# Patient Record
Sex: Female | Born: 1960 | Race: White | Hispanic: No | Marital: Single | Smoking: Current every day smoker
Health system: Southern US, Community
[De-identification: ages and names within clinical notes are randomized; demographics above are authoritative.]

## PROBLEM LIST (undated history)

## (undated) ENCOUNTER — Emergency Department (HOSPITAL_COMMUNITY): Admission: EM | Payer: Medicare Other | Source: Home / Self Care

## (undated) DIAGNOSIS — C801 Malignant (primary) neoplasm, unspecified: Secondary | ICD-10-CM

## (undated) DIAGNOSIS — K219 Gastro-esophageal reflux disease without esophagitis: Secondary | ICD-10-CM

## (undated) DIAGNOSIS — F419 Anxiety disorder, unspecified: Secondary | ICD-10-CM

## (undated) DIAGNOSIS — A159 Respiratory tuberculosis unspecified: Secondary | ICD-10-CM

## (undated) DIAGNOSIS — M1990 Unspecified osteoarthritis, unspecified site: Secondary | ICD-10-CM

## (undated) DIAGNOSIS — G4730 Sleep apnea, unspecified: Secondary | ICD-10-CM

## (undated) DIAGNOSIS — F32A Depression, unspecified: Secondary | ICD-10-CM

## (undated) DIAGNOSIS — M869 Osteomyelitis, unspecified: Secondary | ICD-10-CM

## (undated) DIAGNOSIS — T8859XA Other complications of anesthesia, initial encounter: Secondary | ICD-10-CM

## (undated) DIAGNOSIS — T7840XA Allergy, unspecified, initial encounter: Secondary | ICD-10-CM

## (undated) DIAGNOSIS — Z5189 Encounter for other specified aftercare: Secondary | ICD-10-CM

## (undated) DIAGNOSIS — J449 Chronic obstructive pulmonary disease, unspecified: Secondary | ICD-10-CM

## (undated) HISTORY — DX: Unspecified osteoarthritis, unspecified site: M19.90

## (undated) HISTORY — PX: APPENDECTOMY: SHX54

## (undated) HISTORY — PX: JOINT REPLACEMENT: SHX530

## (undated) HISTORY — DX: Allergy, unspecified, initial encounter: T78.40XA

## (undated) HISTORY — DX: Respiratory tuberculosis unspecified: A15.9

## (undated) HISTORY — DX: Malignant (primary) neoplasm, unspecified: C80.1

## (undated) HISTORY — PX: COLONOSCOPY: SHX174

## (undated) HISTORY — PX: BREAST SURGERY: SHX581

## (undated) HISTORY — DX: Anxiety disorder, unspecified: F41.9

## (undated) HISTORY — PX: COLON SURGERY: SHX602

## (undated) HISTORY — PX: LEG AMPUTATION THROUGH FEMUR: SHX695

## (undated) HISTORY — DX: Chronic obstructive pulmonary disease, unspecified: J44.9

## (undated) HISTORY — DX: Gastro-esophageal reflux disease without esophagitis: K21.9

## (undated) HISTORY — PX: CHOLECYSTECTOMY: SHX55

## (undated) HISTORY — PX: ABDOMINAL HYSTERECTOMY: SHX81

## (undated) HISTORY — DX: Sleep apnea, unspecified: G47.30

## (undated) HISTORY — PX: FRACTURE SURGERY: SHX138

## (undated) HISTORY — DX: Depression, unspecified: F32.A

---

## 2019-01-06 DIAGNOSIS — I82401 Acute embolism and thrombosis of unspecified deep veins of right lower extremity: Secondary | ICD-10-CM

## 2019-01-06 HISTORY — DX: Acute embolism and thrombosis of unspecified deep veins of right lower extremity: I82.401

## 2021-07-27 ENCOUNTER — Emergency Department (HOSPITAL_BASED_OUTPATIENT_CLINIC_OR_DEPARTMENT_OTHER)
Admission: EM | Admit: 2021-07-27 | Discharge: 2021-07-27 | Disposition: A | Discharge status: Home / Self Care | Payer: Medicare Other | Attending: Emergency Medicine | Admitting: Emergency Medicine

## 2021-07-27 ENCOUNTER — Encounter (HOSPITAL_BASED_OUTPATIENT_CLINIC_OR_DEPARTMENT_OTHER): Payer: Self-pay | Admitting: Emergency Medicine

## 2021-07-27 ENCOUNTER — Emergency Department (HOSPITAL_BASED_OUTPATIENT_CLINIC_OR_DEPARTMENT_OTHER): Payer: Medicare Other

## 2021-07-27 ENCOUNTER — Other Ambulatory Visit: Payer: Self-pay

## 2021-07-27 DIAGNOSIS — R6883 Chills (without fever): Secondary | ICD-10-CM | POA: Diagnosis present

## 2021-07-27 DIAGNOSIS — Z5329 Procedure and treatment not carried out because of patient's decision for other reasons: Secondary | ICD-10-CM | POA: Diagnosis not present

## 2021-07-27 DIAGNOSIS — J449 Chronic obstructive pulmonary disease, unspecified: Secondary | ICD-10-CM | POA: Insufficient documentation

## 2021-07-27 DIAGNOSIS — R600 Localized edema: Secondary | ICD-10-CM | POA: Diagnosis not present

## 2021-07-27 HISTORY — DX: Encounter for other specified aftercare: Z51.89

## 2021-07-27 LAB — COMPREHENSIVE METABOLIC PANEL
ALT: 11 U/L (ref 0–44)
AST: 16 U/L (ref 15–41)
Albumin: 3.8 g/dL (ref 3.5–5.0)
Alkaline Phosphatase: 116 U/L (ref 38–126)
Anion gap: 8 (ref 5–15)
BUN: 6 mg/dL (ref 6–20)
CO2: 33 mmol/L — ABNORMAL HIGH (ref 22–32)
Calcium: 8.6 mg/dL — ABNORMAL LOW (ref 8.9–10.3)
Chloride: 96 mmol/L — ABNORMAL LOW (ref 98–111)
Creatinine, Ser: 0.77 mg/dL (ref 0.44–1.00)
GFR, Estimated: >60 mL/min (ref >60)
Glucose, Bld: 96 mg/dL (ref 70–99)
Potassium: 3.7 mmol/L (ref 3.5–5.1)
Sodium: 137 mmol/L (ref 135–145)
Total Bilirubin: 0.4 mg/dL (ref 0.3–1.2)
Total Protein: 7.7 g/dL (ref 6.5–8.1)

## 2021-07-27 LAB — CBC WITH DIFFERENTIAL/PLATELET
Abs Immature Granulocytes: 0.03 10*3/uL (ref 0.00–0.07)
Basophils Absolute: 0.1 10*3/uL (ref 0.0–0.1)
Basophils Relative: 1 %
Eosinophils Absolute: 0.1 10*3/uL (ref 0.0–0.5)
Eosinophils Relative: 1 %
HCT: 45.5 % (ref 36.0–46.0)
Hemoglobin: 14.9 g/dL (ref 12.0–15.0)
Immature Granulocytes: 0 %
Lymphocytes Relative: 27 %
Lymphs Abs: 2.8 10*3/uL (ref 0.7–4.0)
MCH: 31.6 pg (ref 26.0–34.0)
MCHC: 32.7 g/dL (ref 30.0–36.0)
MCV: 96.6 fL (ref 80.0–100.0)
Monocytes Absolute: 0.4 10*3/uL (ref 0.1–1.0)
Monocytes Relative: 4 %
Neutro Abs: 7 10*3/uL (ref 1.7–7.7)
Neutrophils Relative %: 67 %
Platelets: 231 10*3/uL (ref 150–400)
RBC: 4.71 MIL/uL (ref 3.87–5.11)
RDW: 13.2 % (ref 11.5–15.5)
WBC: 10.5 10*3/uL (ref 4.0–10.5)
nRBC: 0 % (ref 0.0–0.2)

## 2021-07-27 LAB — SEDIMENTATION RATE: Sed Rate: 15 mm/hr (ref 0–22)

## 2021-07-27 LAB — LACTIC ACID, PLASMA: Lactic Acid, Venous: 0.8 mmol/L (ref 0.5–1.9)

## 2021-07-27 IMAGING — DX DG FEMUR 1V*R*
1 series · 1 of 1 positions shown · non-contrast
Comparison: None.

CLINICAL DATA: Prior above the knee amputation

EXAM:
RIGHT FEMUR 1 VIEW

[femur lat]
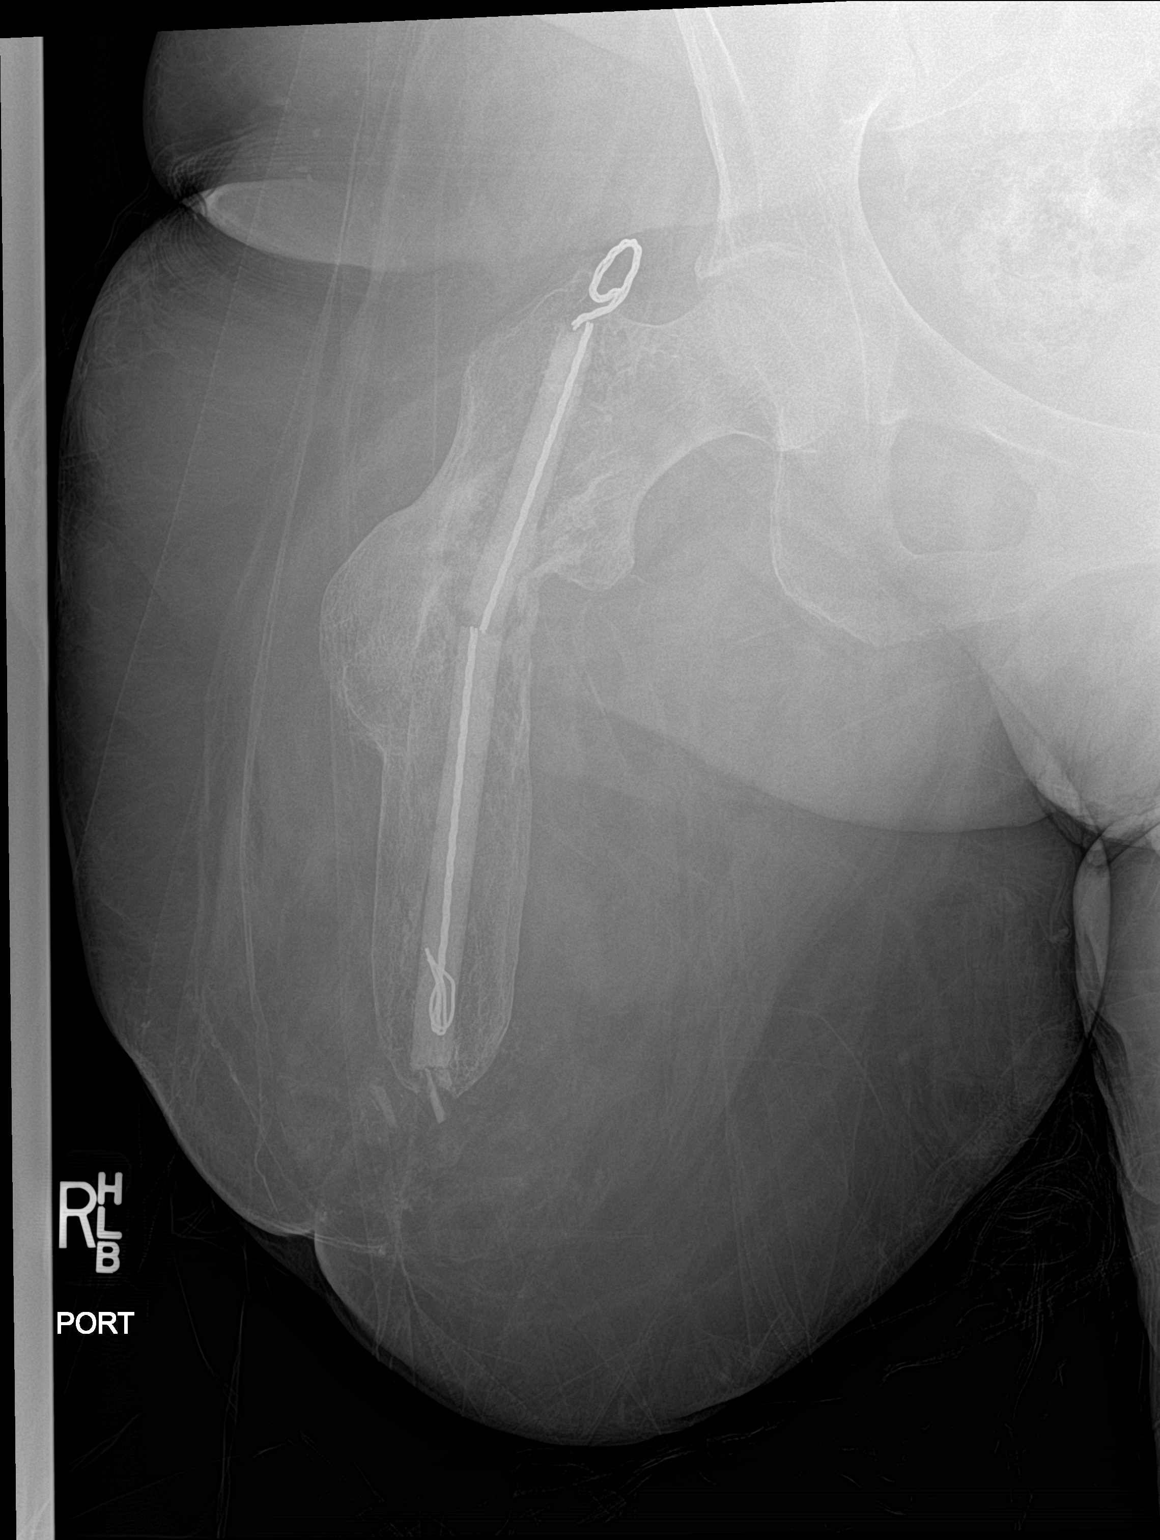

[1 of 1 positions shown; findings below may reference images not displayed]

FINDINGS: Prior above the knee amputation with fractured intramedullary rod.
Bony irregularity and mild fragmentation is seen at the osteotomy
site.
IMPRESSION: Bony irregularity and mild fragmentation seen at the right femur
osteotomy site, potentially postsurgical, although osteomyelitis
could also have this appearance. Recommend comparison with outside
priors if available.

## 2021-07-27 MED ORDER — LORAZEPAM 1 MG PO TABS
0.5000 mg | ORAL_TABLET | Freq: Once | ORAL | Status: AC
  Administered 2021-07-27: 0.5 mg via ORAL
  Filled 2021-07-27: qty 1

## 2021-07-27 MED ORDER — PRAMIPEXOLE DIHYDROCHLORIDE 1 MG PO TABS: 1.0000 mg | ORAL_TABLET | Freq: Once | ORAL | Status: DC

## 2021-07-27 NOTE — ED Triage Notes (Signed)
Chills x 1 month , Hx anemia. Hx osteomyelitis.  ?

## 2021-07-27 NOTE — ED Provider Notes (Incomplete)
°  [facility]Y [facility]PART[facility] Provider Note   CSN: [id number] Arrival date & time: 07/27/21  1306     History {Add pertinent medical, surgical, social history, OB history to HPI:1} Chief Complaint  Patient presents with   Chills    Renee Pitts is a 61 y.o. female.  Patient with history of osteomyelitis  The history is provided by the patient. No language interpreter was used.      Home Medications Prior to Admission medications   Not on File      Allergies    Vancomycin    Review of Systems   Review of Systems  Physical Exam Updated Vital Signs BP 139/88 (BP Location: Right Arm)    Pulse 88    Temp 98 F (36.7 C) (Oral)    Resp 18    Wt 86.2 kg    SpO2 95%  Physical Exam  ED Results / Procedures / Treatments   Labs (all labs ordered are listed, but only abnormal results are displayed) Labs Reviewed  CBC WITH DIFFERENTIAL/PLATELET  COMPREHENSIVE METABOLIC PANEL    EKG None  Radiology No results found.  Procedures Procedures  {Document cardiac monitor, telemetry assessment procedure when appropriate:1}  Medications Ordered in ED Medications - No data to display  ED Course/ Medical Decision Making/ A&P                           Medical Decision Making Amount and/or Complexity of Data Reviewed Labs: ordered. Radiology: ordered.   ***  {Document critical care time when appropriate:1} {Document review of labs and clinical decision tools ie heart score, Chads2Vasc2 etc:1}  {Document your independent review of radiology images, and any outside records:1} {Document your discussion with family members, caretakers, and with consultants:1} {Document social determinants of health affecting pt's care:1} {Document your decision making why or why not admission, treatments were needed:1} Final Clinical Impression(s) / ED Diagnoses Final diagnoses:  None    Rx / DC Orders ED Discharge Orders     None

## 2021-07-27 NOTE — ED Notes (Signed)
PA Sarah into [facility] to pt , pt sitting in her w/c stating she didn't realize it was going to take so long and she was given meds for her  restless leg , pt states she is living in hotel and had not atken any of her meds today ?

## 2021-07-28 LAB — C-REACTIVE PROTEIN: CRP: 0.8 mg/dL (ref <1.0)

## 2021-08-13 ENCOUNTER — Encounter (HOSPITAL_COMMUNITY): Payer: Self-pay | Admitting: Emergency Medicine

## 2021-08-13 ENCOUNTER — Emergency Department (HOSPITAL_COMMUNITY): Payer: Medicare Other

## 2021-08-13 ENCOUNTER — Emergency Department (HOSPITAL_COMMUNITY)
Admission: EM | Admit: 2021-08-13 | Discharge: 2021-08-14 | Disposition: A | Discharge status: Home / Self Care | Payer: Medicare Other | Attending: Emergency Medicine | Admitting: Emergency Medicine

## 2021-08-13 ENCOUNTER — Other Ambulatory Visit: Payer: Self-pay

## 2021-08-13 DIAGNOSIS — M79651 Pain in right thigh: Secondary | ICD-10-CM | POA: Diagnosis not present

## 2021-08-13 DIAGNOSIS — R200 Anesthesia of skin: Secondary | ICD-10-CM | POA: Diagnosis not present

## 2021-08-13 DIAGNOSIS — J449 Chronic obstructive pulmonary disease, unspecified: Secondary | ICD-10-CM | POA: Diagnosis not present

## 2021-08-13 DIAGNOSIS — R5383 Other fatigue: Secondary | ICD-10-CM | POA: Insufficient documentation

## 2021-08-13 DIAGNOSIS — R4789 Other speech disturbances: Secondary | ICD-10-CM | POA: Insufficient documentation

## 2021-08-13 DIAGNOSIS — R1031 Right lower quadrant pain: Secondary | ICD-10-CM | POA: Diagnosis not present

## 2021-08-13 DIAGNOSIS — R531 Weakness: Secondary | ICD-10-CM | POA: Diagnosis not present

## 2021-08-13 DIAGNOSIS — R519 Headache, unspecified: Secondary | ICD-10-CM | POA: Diagnosis present

## 2021-08-13 DIAGNOSIS — R479 Unspecified speech disturbances: Secondary | ICD-10-CM

## 2021-08-13 LAB — COMPREHENSIVE METABOLIC PANEL
ALT: 12 U/L (ref 0–44)
AST: 19 U/L (ref 15–41)
Albumin: 3.4 g/dL — ABNORMAL LOW (ref 3.5–5.0)
Alkaline Phosphatase: 113 U/L (ref 38–126)
Anion gap: 5 (ref 5–15)
BUN: 8 mg/dL (ref 6–20)
CO2: 32 mmol/L (ref 22–32)
Calcium: 8.7 mg/dL — ABNORMAL LOW (ref 8.9–10.3)
Chloride: 103 mmol/L (ref 98–111)
Creatinine, Ser: 1.08 mg/dL — ABNORMAL HIGH (ref 0.44–1.00)
GFR, Estimated: 59 mL/min — ABNORMAL LOW (ref >60)
Glucose, Bld: 101 mg/dL — ABNORMAL HIGH (ref 70–99)
Potassium: 3.5 mmol/L (ref 3.5–5.1)
Sodium: 140 mmol/L (ref 135–145)
Total Bilirubin: 0.3 mg/dL (ref 0.3–1.2)
Total Protein: 6.9 g/dL (ref 6.5–8.1)

## 2021-08-13 LAB — CBC WITH DIFFERENTIAL/PLATELET
Abs Immature Granulocytes: 0.05 10*3/uL (ref 0.00–0.07)
Basophils Absolute: 0.1 10*3/uL (ref 0.0–0.1)
Basophils Relative: 1 %
Eosinophils Absolute: 0.1 10*3/uL (ref 0.0–0.5)
Eosinophils Relative: 1 %
HCT: 44 % (ref 36.0–46.0)
Hemoglobin: 14.5 g/dL (ref 12.0–15.0)
Immature Granulocytes: 1 %
Lymphocytes Relative: 27 %
Lymphs Abs: 2.7 10*3/uL (ref 0.7–4.0)
MCH: 31.7 pg (ref 26.0–34.0)
MCHC: 33 g/dL (ref 30.0–36.0)
MCV: 96.1 fL (ref 80.0–100.0)
Monocytes Absolute: 0.4 10*3/uL (ref 0.1–1.0)
Monocytes Relative: 4 %
Neutro Abs: 6.8 10*3/uL (ref 1.7–7.7)
Neutrophils Relative %: 66 %
Platelets: 198 10*3/uL (ref 150–400)
RBC: 4.58 MIL/uL (ref 3.87–5.11)
RDW: 12.5 % (ref 11.5–15.5)
WBC: 10.1 10*3/uL (ref 4.0–10.5)
nRBC: 0 % (ref 0.0–0.2)

## 2021-08-13 LAB — TSH: TSH: 0.897 u[IU]/mL (ref 0.350–4.500)

## 2021-08-13 LAB — LIPASE, BLOOD: Lipase: 26 U/L (ref 11–51)

## 2021-08-13 LAB — LACTIC ACID, PLASMA: Lactic Acid, Venous: 1 mmol/L (ref 0.5–1.9)

## 2021-08-13 IMAGING — DX DG CHEST 1V PORT
1 series · 1 of 1 positions shown · non-contrast
Comparison: None.

CLINICAL DATA: Headache and difficulty finding words x3 days.

EXAM:
PORTABLE CHEST 1 VIEW

[chest ap]
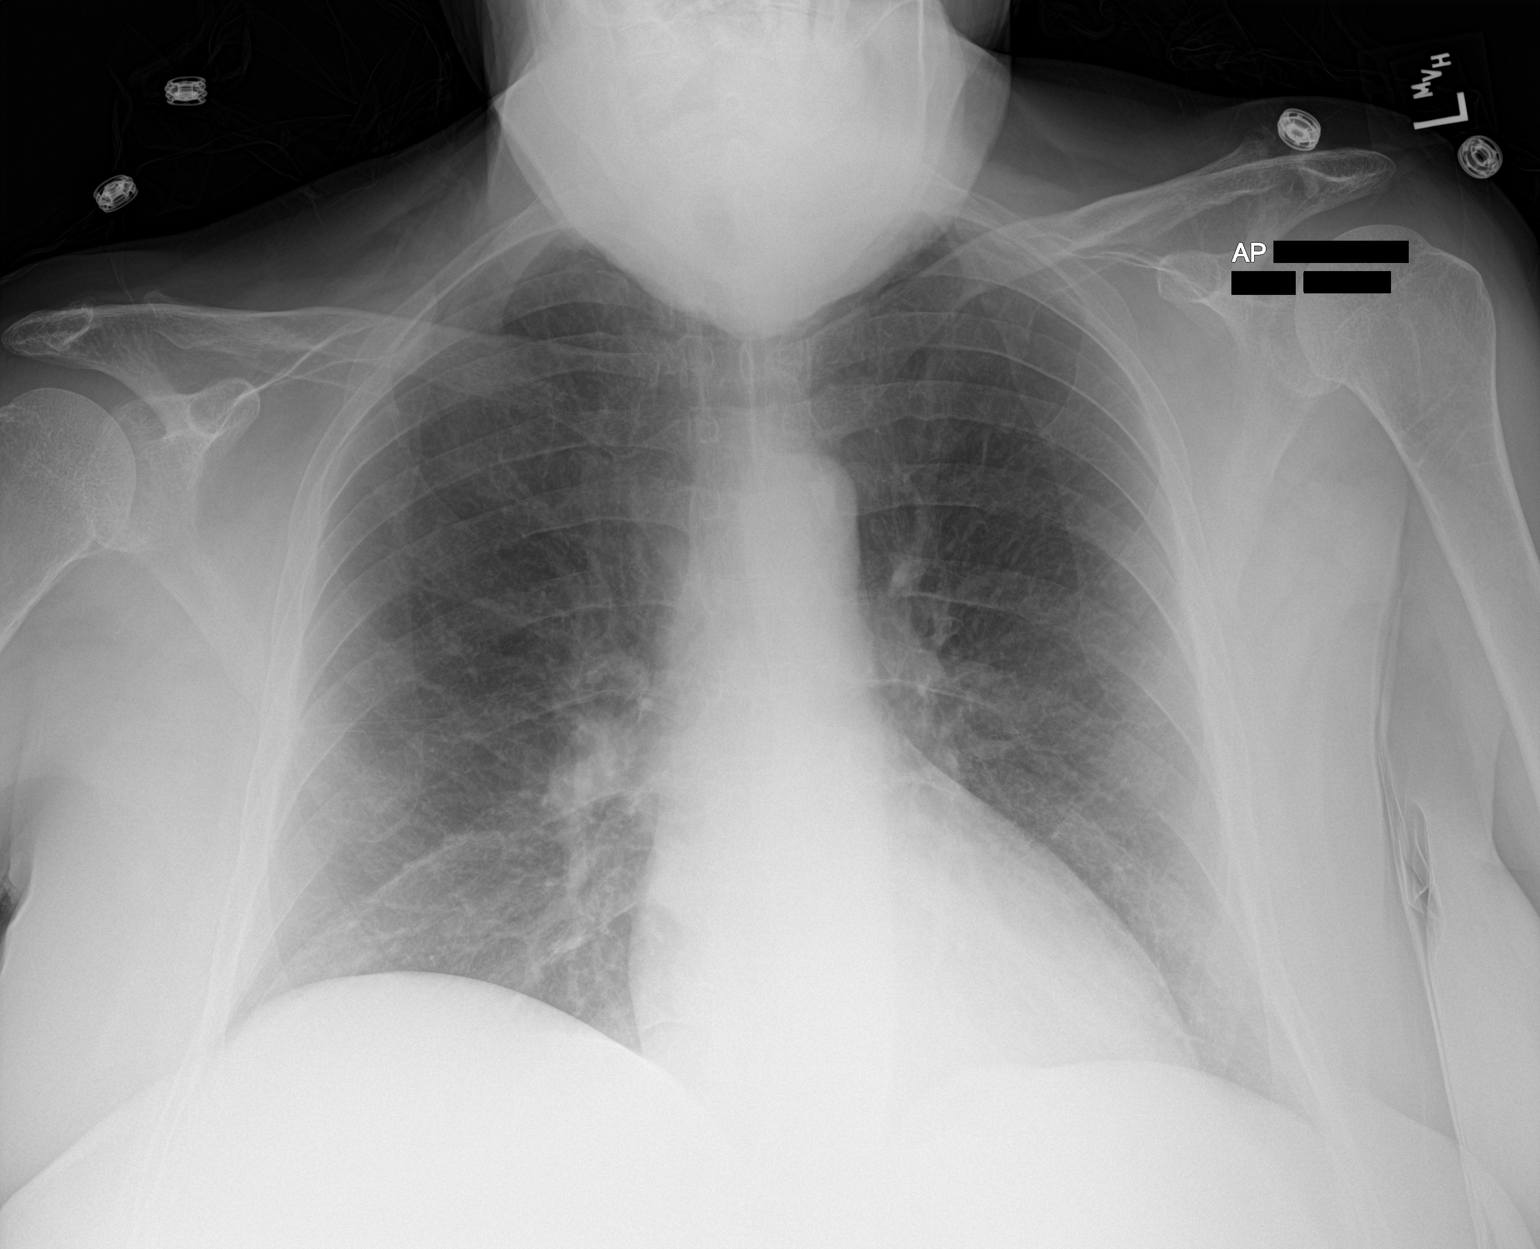

[1 of 1 positions shown; findings below may reference images not displayed]

FINDINGS: The heart size and mediastinal contours are within normal limits.
Both lungs are clear. The visualized skeletal structures are
unremarkable.
IMPRESSION: No active disease.

## 2021-08-13 IMAGING — CT CT ABD-PELV W/ CM
2 of 5 series · 16 of 46 positions shown, 18 images · IV contrast (agent unspecified)
Comparison: None.

CLINICAL DATA: Right lower quadrant abdominal pain.

EXAM:
CT ABDOMEN AND PELVIS WITH CONTRAST
TECHNIQUE: Multidetector CT imaging of the abdomen and pelvis was performed
using the standard protocol following bolus administration of
intravenous contrast.

[Series 3: axial st · axial · 0.98mm/px · z∈[-922,-482]mm · 13 of 102 slices shown, 15 images]
[im 7/102  soft-tissue]
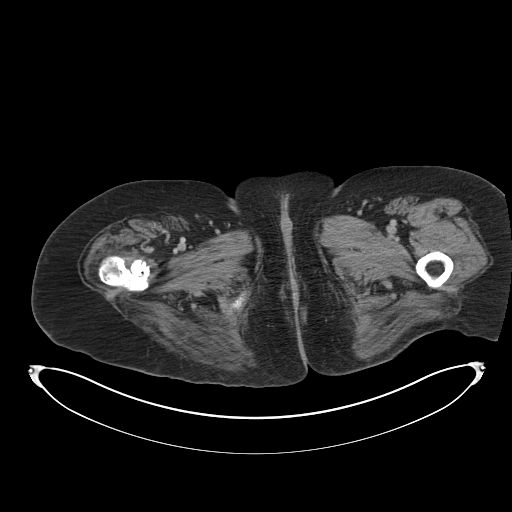
[im 7/102  bone]
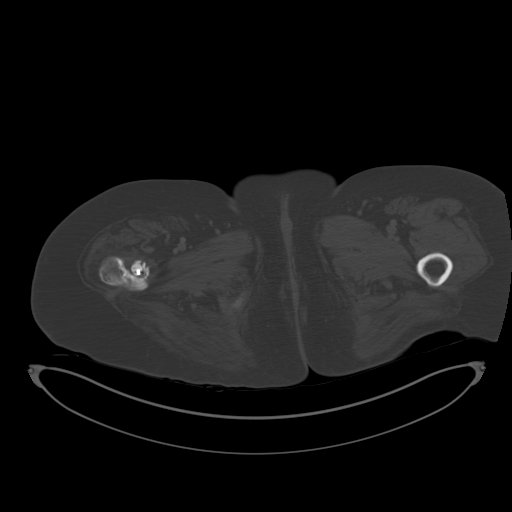
[im 14/102  soft-tissue]
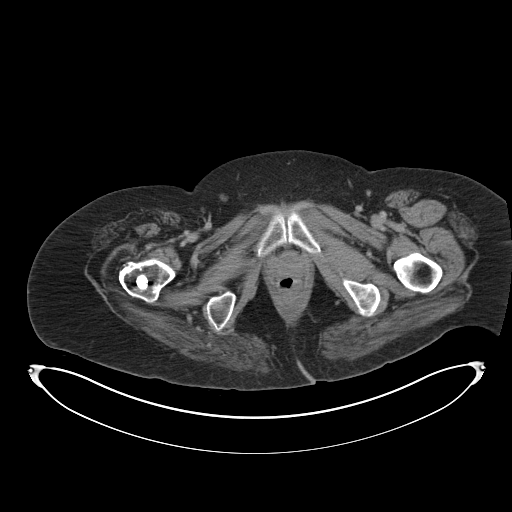
[im 21/102  soft-tissue]
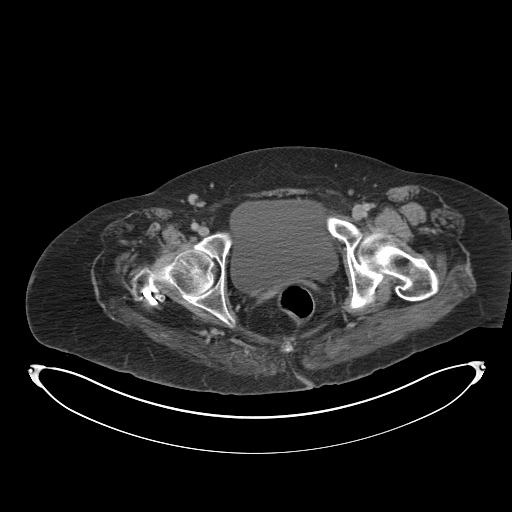
[im 27/102  soft-tissue]
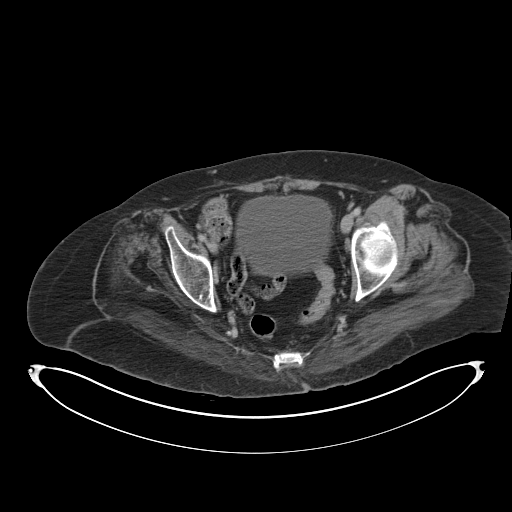
[im 34/102  soft-tissue]
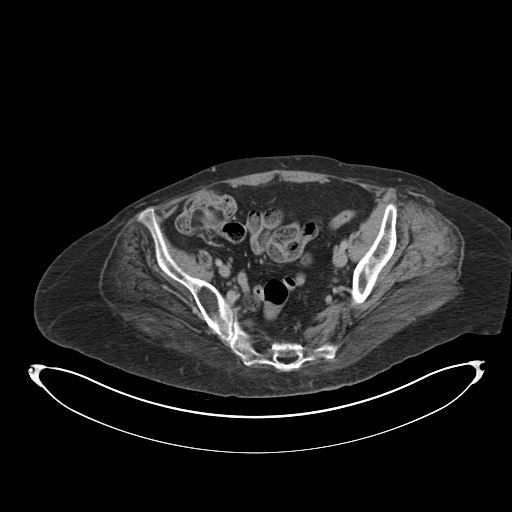
[im 41/102  soft-tissue]
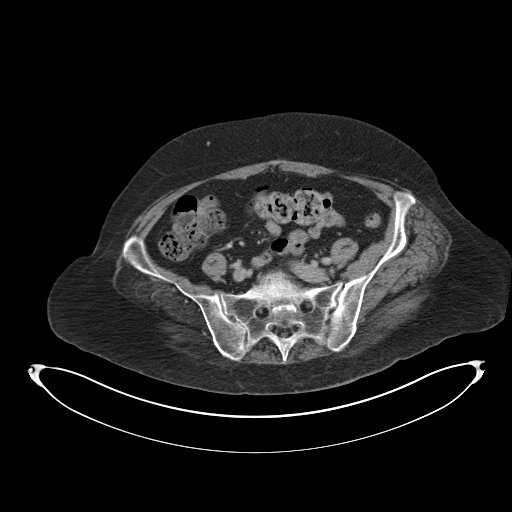
[im 54/102  soft-tissue]
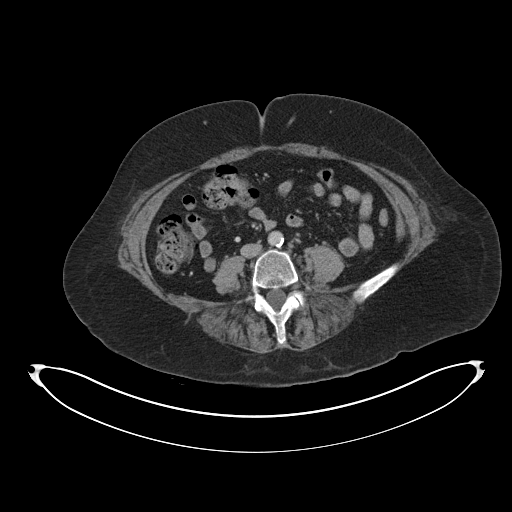
[im 61/102  soft-tissue]
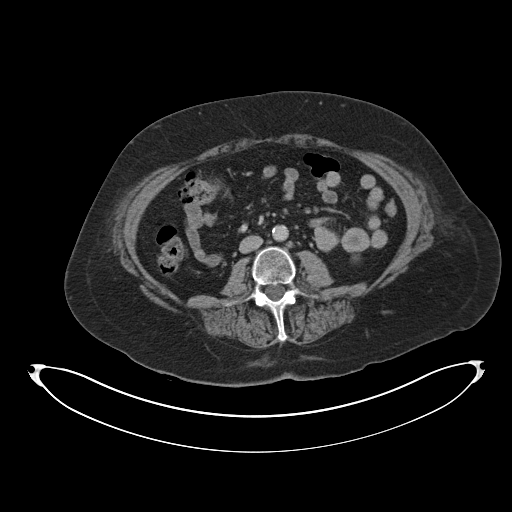
[im 68/102  soft-tissue]
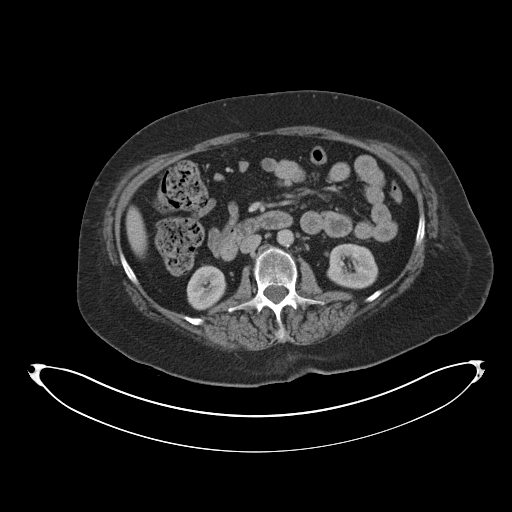
[im 68/102  bone]
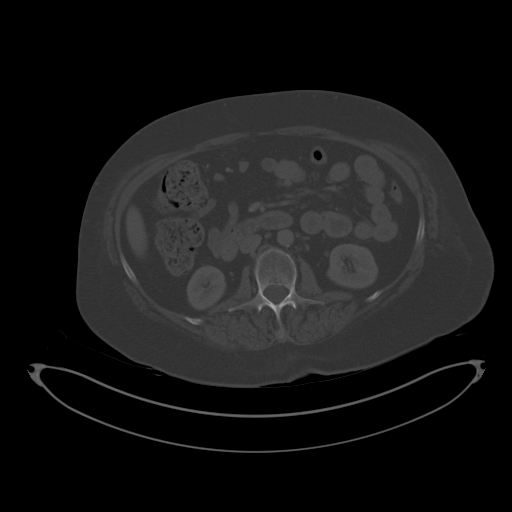
[im 75/102  soft-tissue]
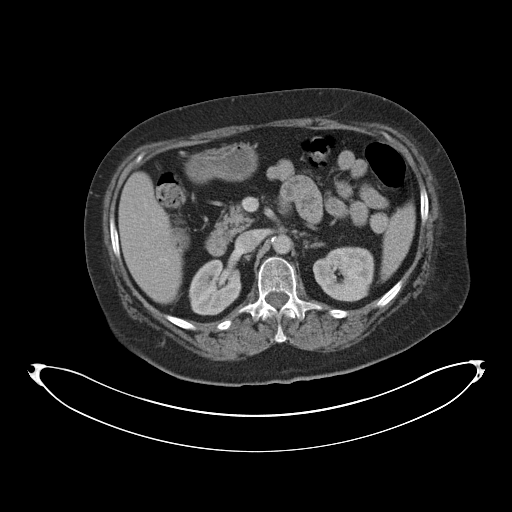
[im 81/102  soft-tissue]
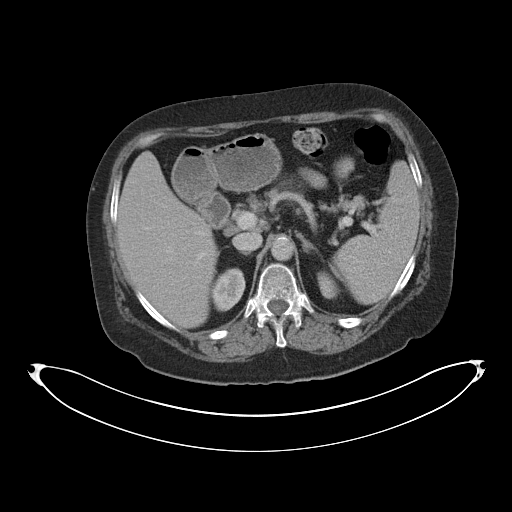
[im 88/102  soft-tissue]
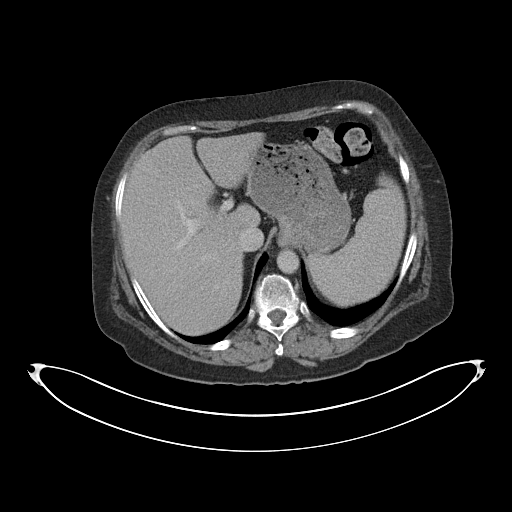
[im 95/102  soft-tissue]
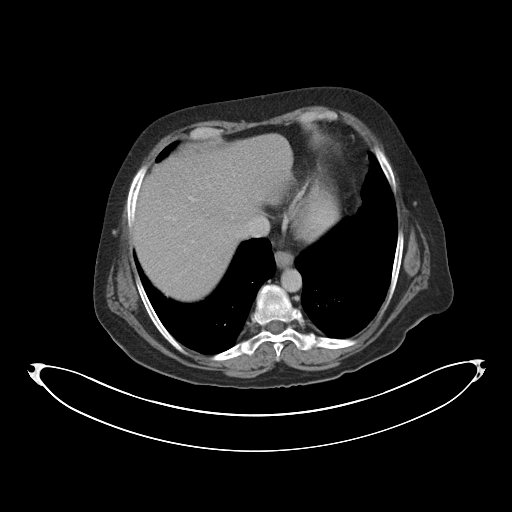

[Series 7: coronal st · coronal · 1.00mm/px · 3 of 169 slices shown]
[im 57/169  soft-tissue]
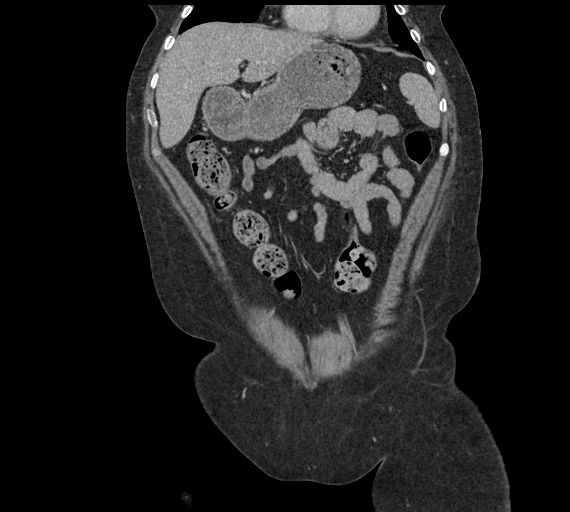
[im 75/169  soft-tissue]
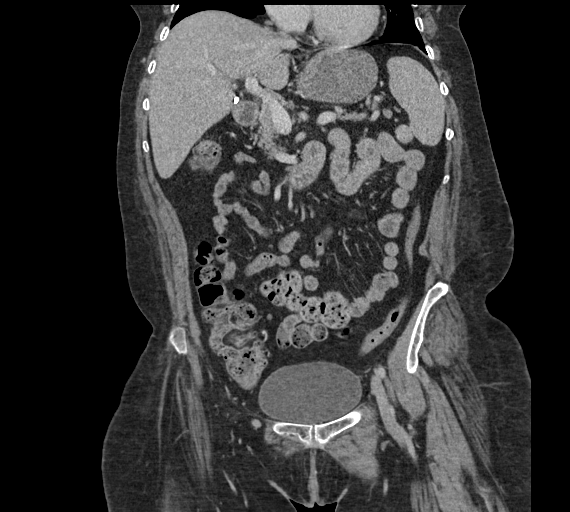
[im 94/169  soft-tissue]
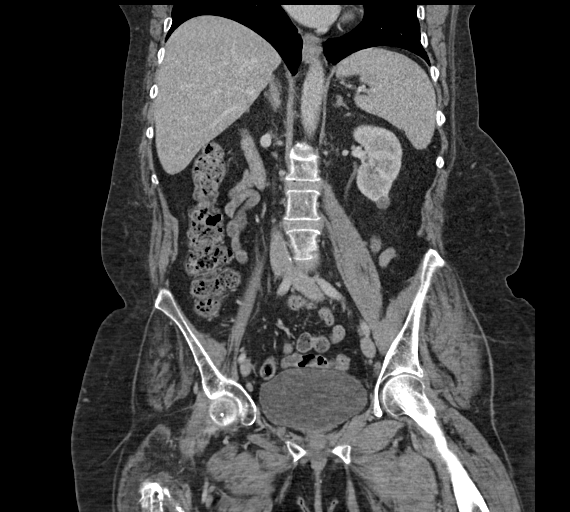

[16 of 46 positions shown; findings below may reference images not displayed]

RADIATION DOSE REDUCTION: This exam was performed according to the
departmental dose-optimization program which includes automated
exposure control, adjustment of the mA and/or kV according to
patient size and/or use of iterative reconstruction technique.

CONTRAST:  80mL OMNIPAQUE IOHEXOL 300 MG/ML  SOLN
FINDINGS: Lower chest: Minimal bibasilar dependent atelectasis. The visualized
lung bases are otherwise clear.

No intra-abdominal free air or free fluid.

Hepatobiliary: Probable mild fatty liver. No intrahepatic biliary
dilatation. Cholecystectomy.

Pancreas: Unremarkable. No pancreatic ductal dilatation or
surrounding inflammatory changes.

Spleen: Normal in size without focal abnormality.

Adrenals/Urinary Tract: The adrenal glands are unremarkable. There
is a 15 mm left renal inferior pole cyst. There is no hydronephrosis
on either side. There is symmetric enhancement and excretion of
contrast by both kidneys. The visualized ureters and urinary bladder
appear unremarkable.

Stomach/Bowel: There is no bowel obstruction or active inflammation.
The appendix is not visualized with certainty. No inflammatory
changes identified in the right lower quadrant.

Vascular/Lymphatic: Mild aortoiliac atherosclerotic disease. The IVC
is unremarkable. No portal venous gas. There is no adenopathy.

Reproductive: Hysterectomy.  No adnexal masses.

Other: None

Musculoskeletal: Prior ORIF of right femur. There is fracture of the
visualized proximal aspect of the intramedullary rod or segment.
There is a large lucency with an age indeterminate nondisplaced
fracture of the anterior cortex of the femur which may represent a
pathologic fracture. Similar findings were seen on the radiograph of
[DATE]. Direct comparison with prior images, if available,
recommended. No other acute fracture. Old L2 compression fracture.
IMPRESSION: 1. No acute intra-abdominal or pelvic pathology.
2. Prior ORIF of right femur with fracture of the visualized
proximal aspect of the intramedullary rod or segment. There is a
large lucency with an age indeterminate nondisplaced fracture of the
anterior cortex of the femur which may represent a pathologic
fracture. Similar findings were seen on the radiograph of
[DATE]. Direct comparison with prior images, if available,
recommended.
3. Aortic Atherosclerosis ([QC]-[QC]).

## 2021-08-13 IMAGING — CT CT FEMUR *R* W/ CM
3 of 4 series · 13 of 33 positions shown, 16 images · IV contrast (agent unspecified)
Comparison: [DATE].

CLINICAL DATA: Osteomyelitis suspected, femur.

EXAM:
CT OF THE LOWER RIGHT EXTREMITY WITH CONTRAST
TECHNIQUE: Multidetector CT imaging of the lower right extremity was performed
according to the standard protocol following intravenous contrast
administration.

[Series 4: axial bone · axial · 0.62mm/px · z∈[-1118,-852]mm · 5 of 173 slices shown, 7 images]
[im 20/173  soft-tissue]
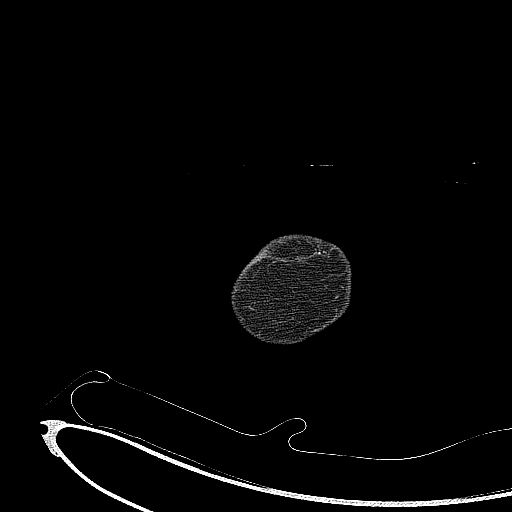
[im 20/173  bone]
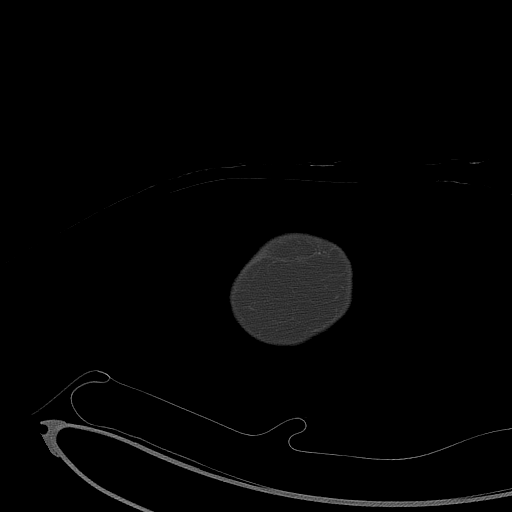
[im 58/173  bone]
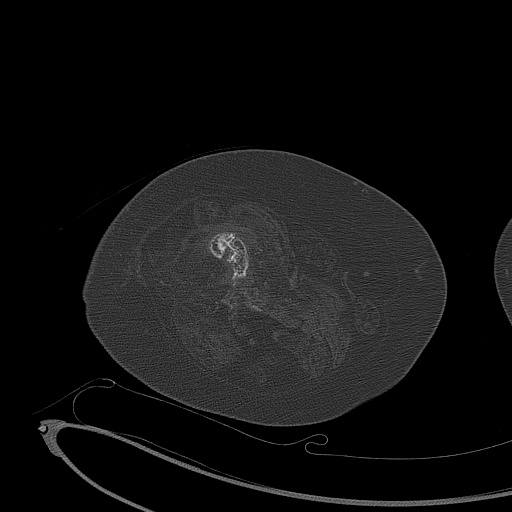
[im 96/173  bone]
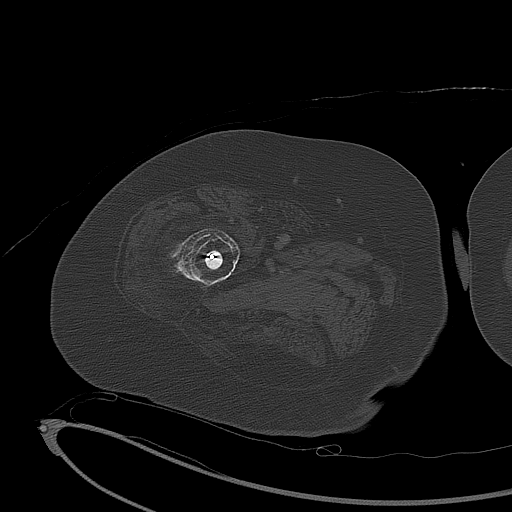
[im 115/173  bone]
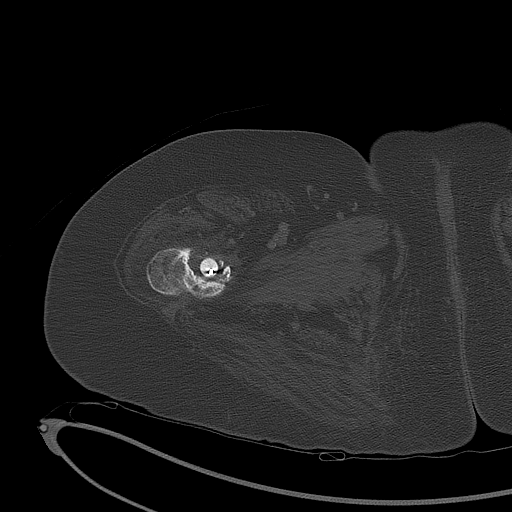
[im 153/173  soft-tissue]
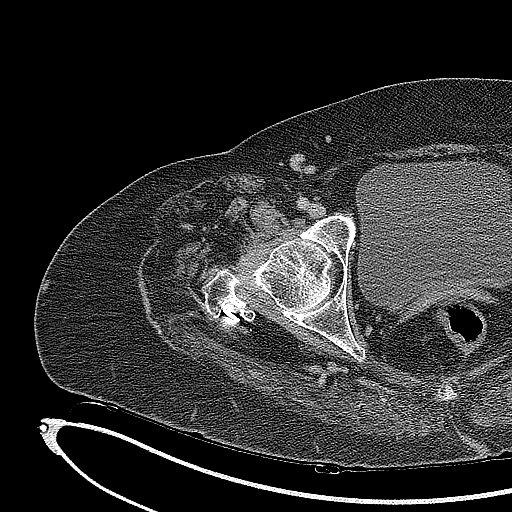
[im 153/173  bone]
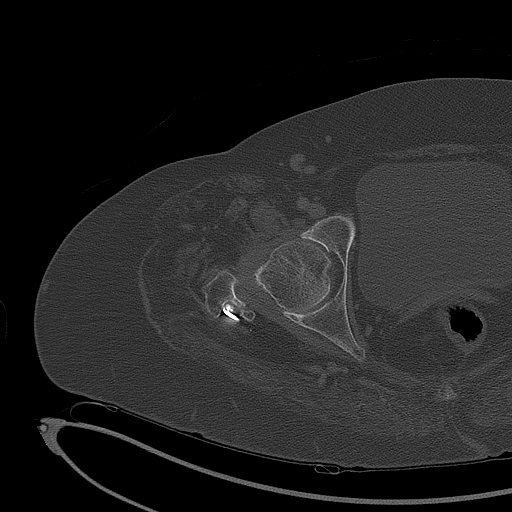

[Series 9: coronal st · coronal · 0.64mm/px · 3 of 135 slices shown]
[im 27/135  bone]
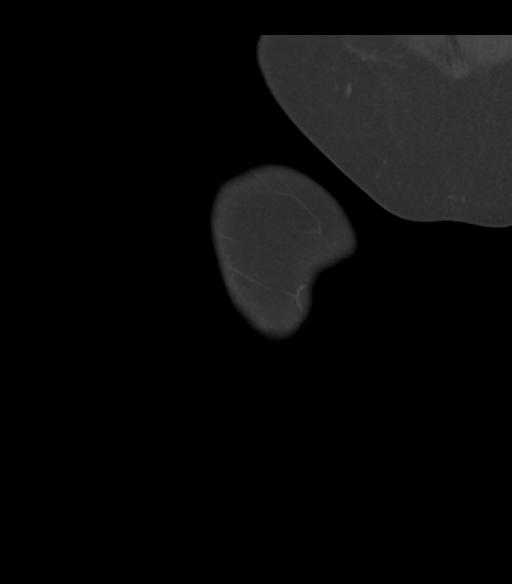
[im 54/135  bone]
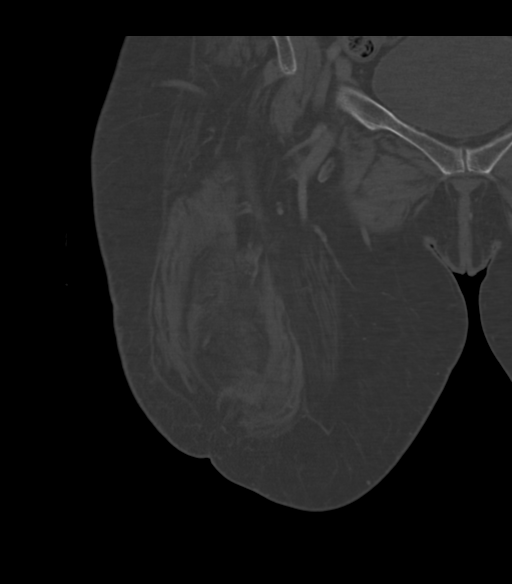
[im 81/135  bone]
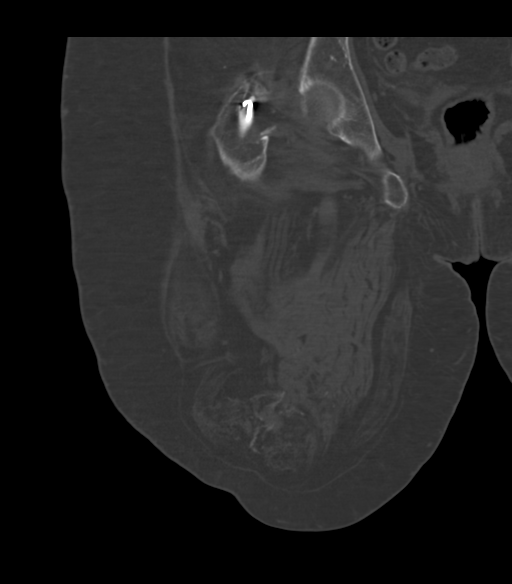

[Series 10: sagittal st · sagittal · 0.52mm/px · 5 of 149 slices shown, 6 images]
[im 50/149  bone]
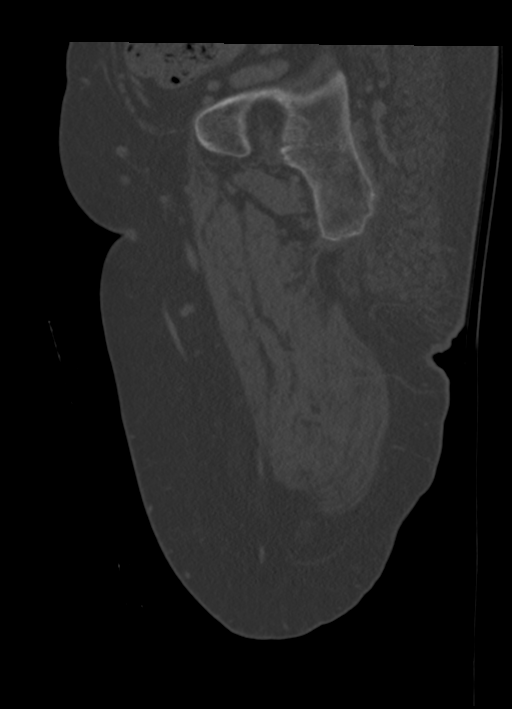
[im 62/149  bone]
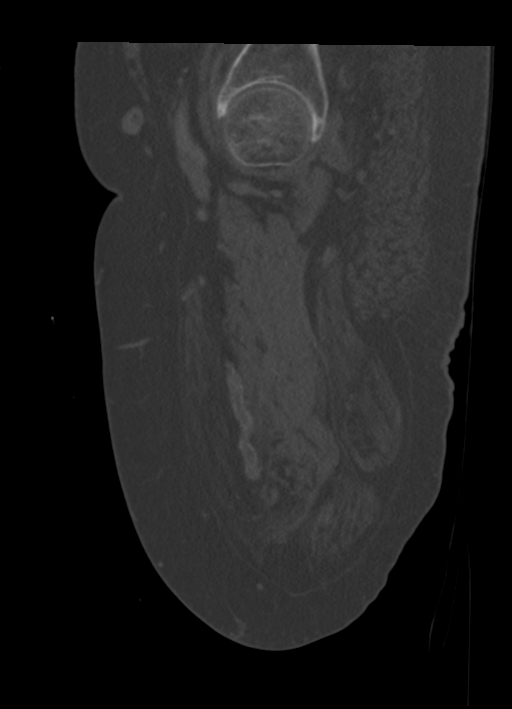
[im 75/149  soft-tissue]
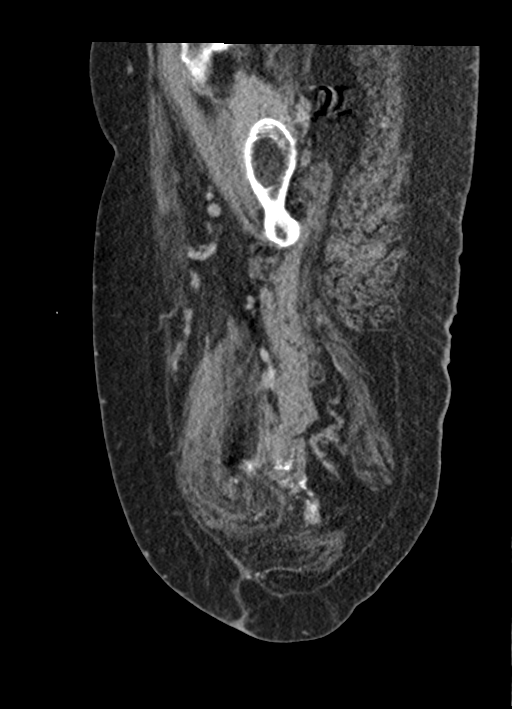
[im 75/149  bone]
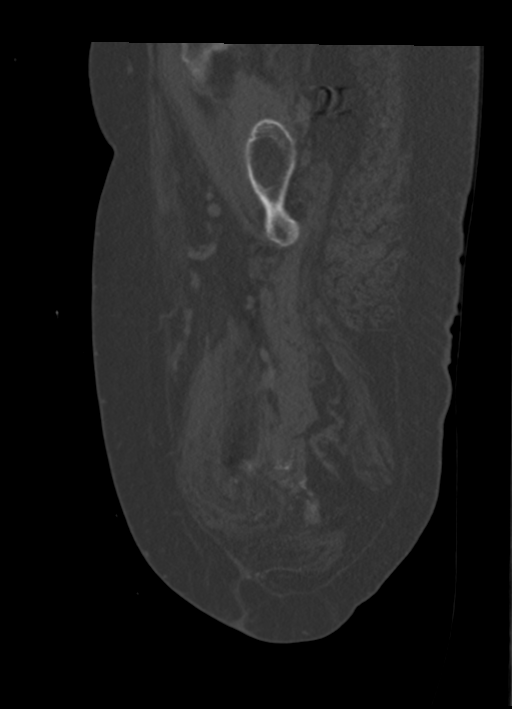
[im 87/149  bone]
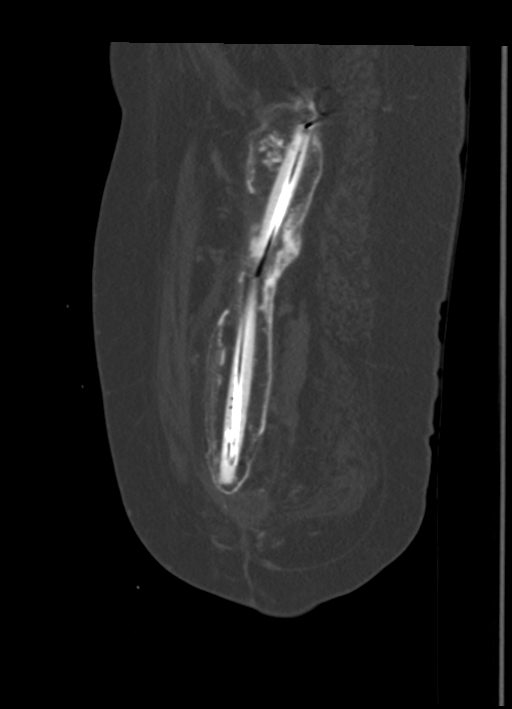
[im 99/149  bone]
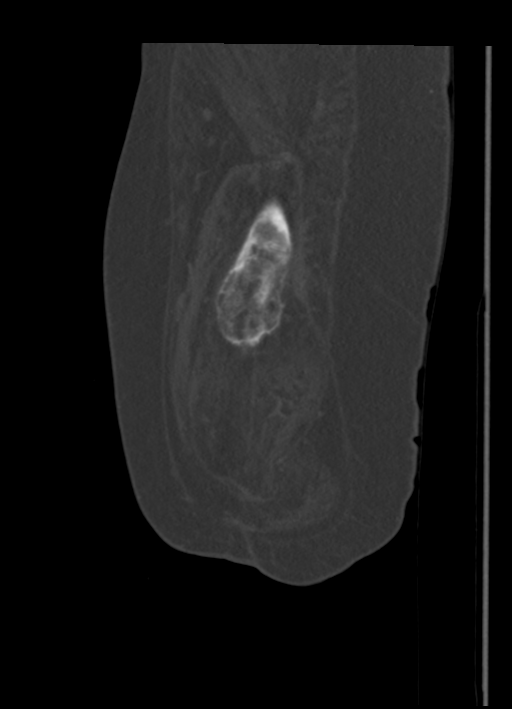

[13 of 33 positions shown; findings below may reference images not displayed]

RADIATION DOSE REDUCTION: This exam was performed according to the
departmental dose-optimization program which includes automated
exposure control, adjustment of the mA and/or kV according to
patient size and/or use of iterative reconstruction technique.

CONTRAST:  80mL OMNIPAQUE IOHEXOL 300 MG/ML  SOLN
FINDINGS: Bones/Joint/Cartilage

The patient is status post above the knee amputation at the level of
the proximal femoral shaft. There are linear bony fragments adjacent
to the amputation site with lucencies in this region. No periosteal
elevation is seen. There is an old fracture deformity with
intramedullary rod in place involving the proximal femur with
surrounding lucency and bony fragments.

Ligaments

Suboptimally assessed by CT.

Muscles and Tendons

Muscular atrophy with fatty infiltration is noted and most
pronounced in the quadriceps compartment.

Soft tissues

There is a circumscribed fluid collection with a thickened wall at
the base of the amputation measuring 2.7 x 2.1 cm. Bony fragments
are seen adjacent to this region and may be postsurgical. No
inguinal lymphadenopathy is seen.
IMPRESSION: 1. Status post above-the-knee amputation. There is a circumscribed
fluid collection at the amputation site measuring 2.7 x 2.1 cm with
a thickened wall. Findings may represent seroma, hematoma or
abscess.
2. Old fracture deformity in the proximal femur with intramedullary
rod in place. Lucencies and bony fragments surround the
intramedullary rod. There are also bony fragments and lucencies at
the amputation site. Comparison with older imaging studies would be
beneficial if available. The possibility of osteomyelitis can not be
completely excluded. MRI is suggested for further evaluation.

## 2021-08-13 IMAGING — CT CT HEAD W/O CM
3 series · 14 of 47 positions shown, 16 images · non-contrast
Comparison: Comparison made with brain MRI performed on the same
day.

CLINICAL DATA: Initial evaluation for acute headache.



[Series 2: head wo · axial · 0.47mm/px · z∈[-198,-68]mm · 8 of 32 slices shown, 10 images]
[im 3/32  brain]
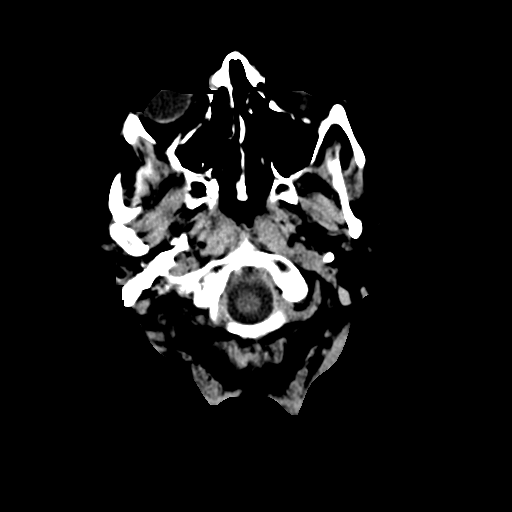
[im 3/32  bone]
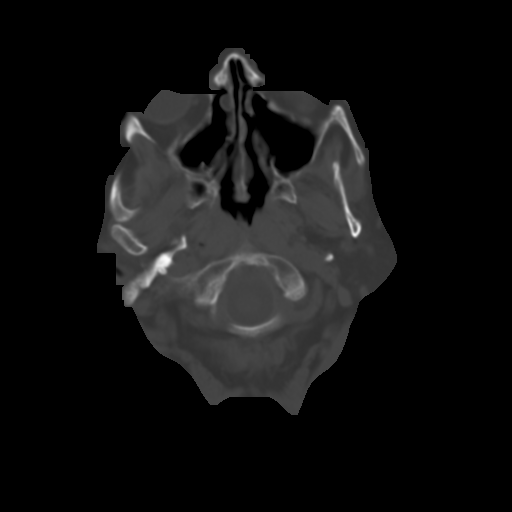
[im 7/32  brain]
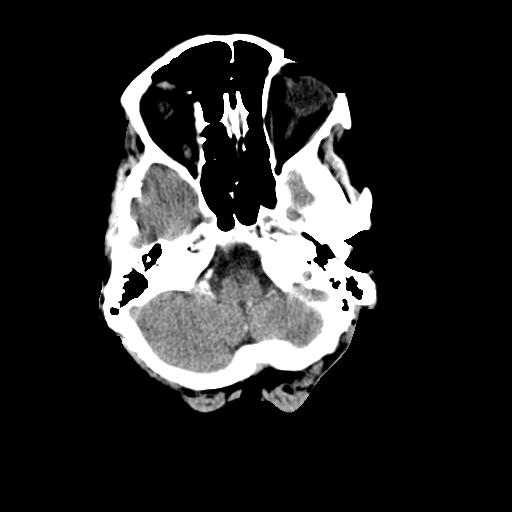
[im 10/32  brain]
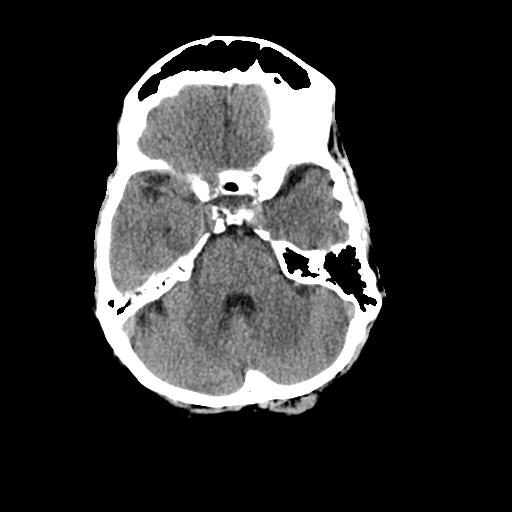
[im 14/32  brain]
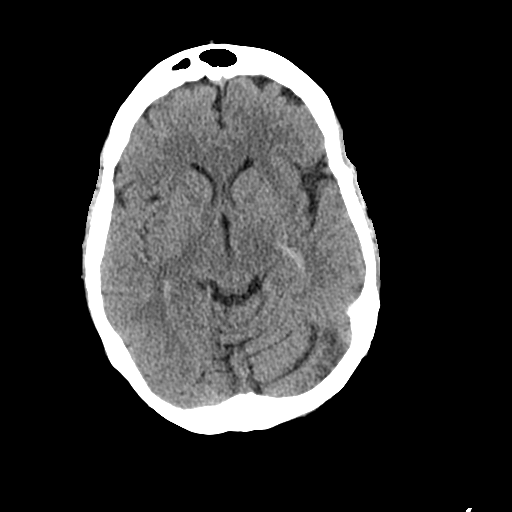
[im 18/32  brain]
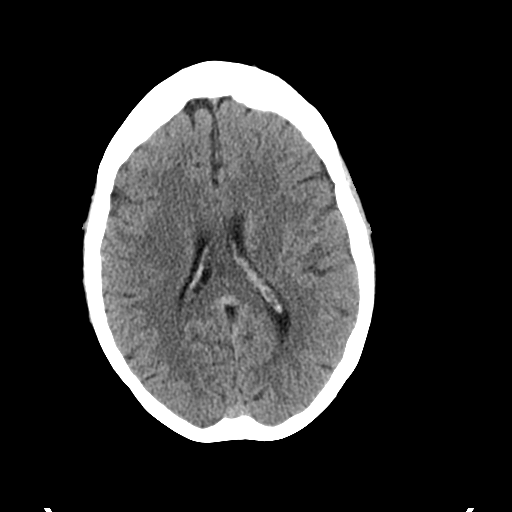
[im 18/32  bone]
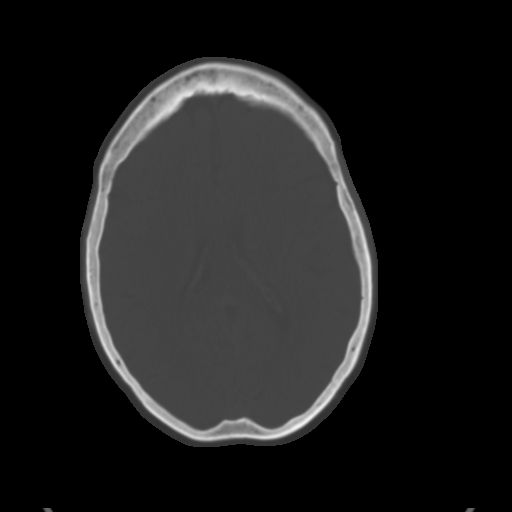
[im 22/32  brain]
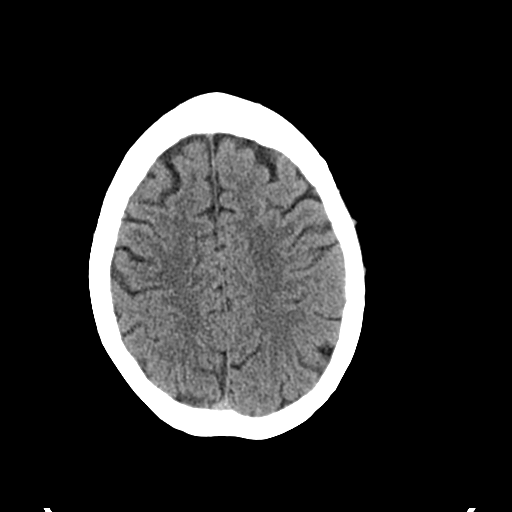
[im 25/32  brain]
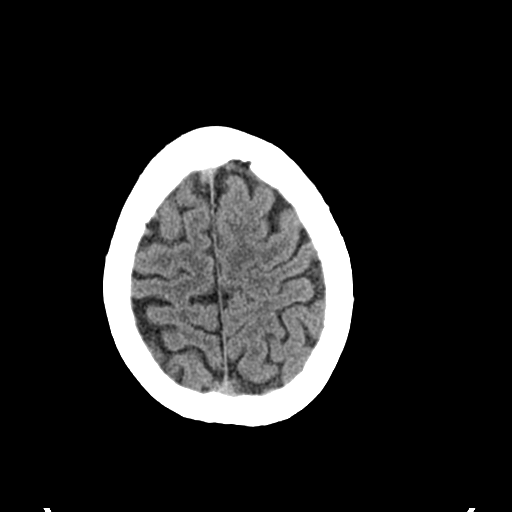
[im 29/32  brain]
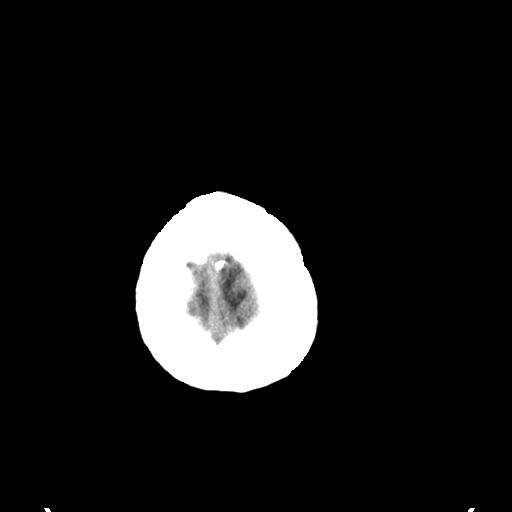

[Series 5: coronal soft tissue · coronal · 0.31mm/px · 3 of 81 slices shown]
[im 27/81  brain]
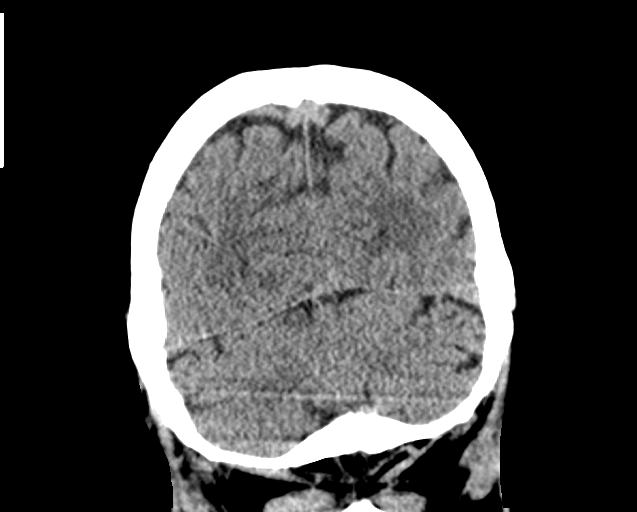
[im 36/81  brain]
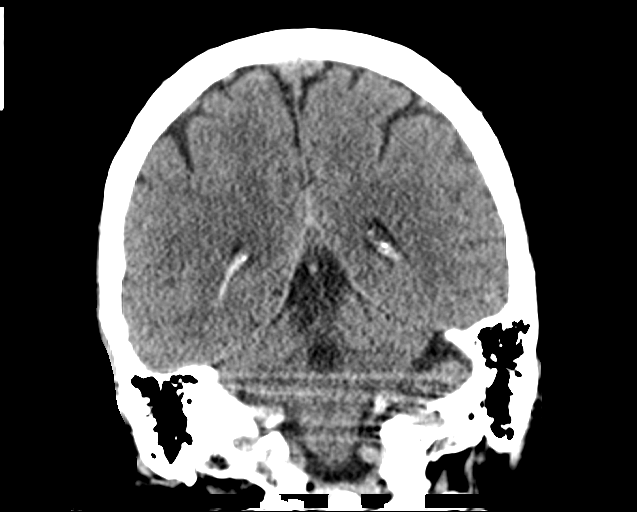
[im 45/81  brain]
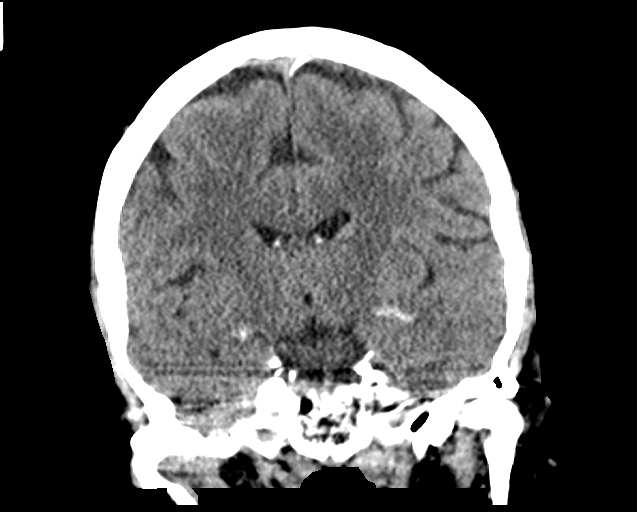

[Series 6: sagittal soft tissue · sagittal · 0.34mm/px · 3 of 60 slices shown]
[im 20/60  brain]
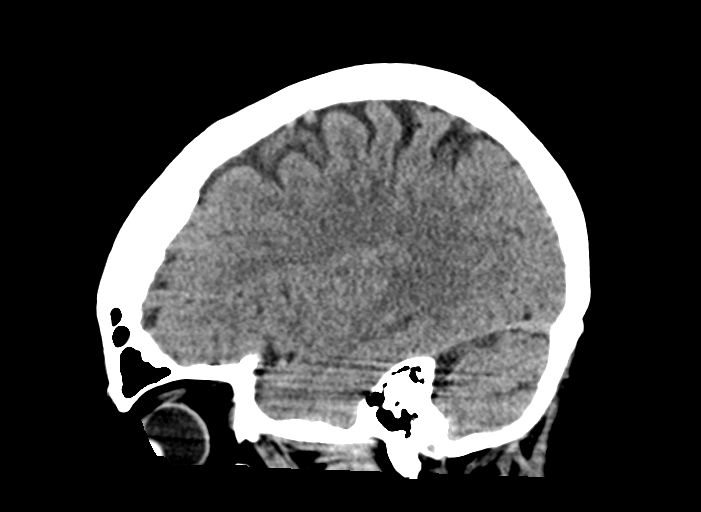
[im 30/60  brain]
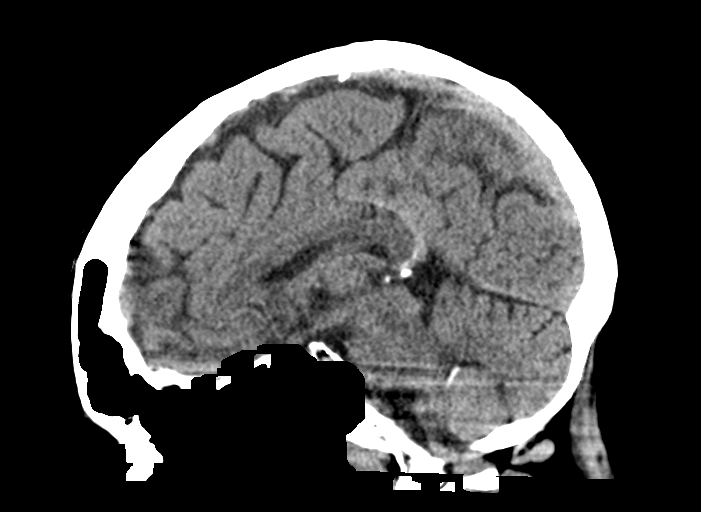
[im 40/60  brain]
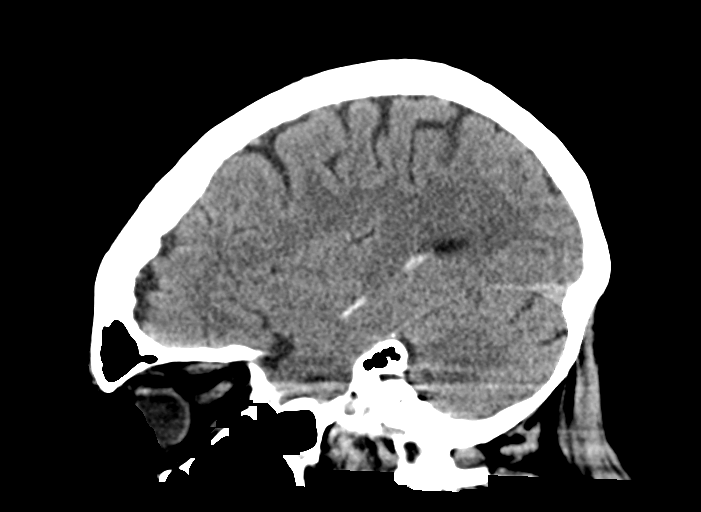

[14 of 47 positions shown; findings below may reference images not displayed]

FINDINGS: Brain: Cerebral volume within normal limits for patient age.

No evidence for acute intracranial hemorrhage. No findings to
suggest acute large vessel territory infarct. No mass lesion,
midline shift, or mass effect. Ventricles are normal in size without
evidence for hydrocephalus. No extra-axial fluid collection
identified.

Vascular: No hyperdense vessel identified.

Skull: Scalp soft tissues demonstrate no acute abnormality.
Calvarium intact.

Sinuses/Orbits: Globes and orbital soft tissues within normal
limits.

Visualized paranasal sinuses are clear. Trace right mastoid
effusion, of doubtful significance.
IMPRESSION: Negative head CT. No acute intracranial abnormality.

## 2021-08-13 MED ORDER — ONDANSETRON HCL 4 MG/2ML IJ SOLN
4.0000 mg | Freq: Once | INTRAMUSCULAR | Status: AC
  Administered 2021-08-13: 4 mg via INTRAVENOUS
  Filled 2021-08-13: qty 2

## 2021-08-13 MED ORDER — HYDROMORPHONE HCL 1 MG/ML IJ SOLN
1.0000 mg | Freq: Once | INTRAMUSCULAR | Status: AC
Start: 2021-08-13 — End: 2021-08-13
  Administered 2021-08-13: 1 mg via INTRAVENOUS
  Filled 2021-08-13: qty 1

## 2021-08-13 MED ORDER — IOHEXOL 300 MG/ML  SOLN
100.0000 mL | Freq: Once | INTRAMUSCULAR | Status: AC | PRN
  Administered 2021-08-13: 80 mL via INTRAVENOUS

## 2021-08-13 MED ORDER — GADOBUTROL 1 MMOL/ML IV SOLN
9.0000 mL | Freq: Once | INTRAVENOUS | Status: AC | PRN
  Administered 2021-08-13: 9 mL via INTRAVENOUS

## 2021-08-13 MED ORDER — MORPHINE SULFATE (PF) 4 MG/ML IV SOLN
4.0000 mg | Freq: Once | INTRAVENOUS | Status: AC
  Administered 2021-08-13: 4 mg via INTRAVENOUS
  Filled 2021-08-13: qty 1

## 2021-08-13 NOTE — ED Notes (Signed)
Pt's family came out to [employer] and advised Probation officer that pt might be having a reaction to the morphine she was just given. Pt states " I told that nurse that I was allergic to Morphine and that something was wrong. 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 Pt denied any shortness of breath, trouble swallowing, or tingling to her mouth or tongue. Pt also states " I cannot take Benadryl it makes me hyper. I take something with zine on the end of it." Dr. Sherry Ruffing made aware of same and is en route to pt's bedside to discuss this issue with the pt.  ?

## 2021-08-13 NOTE — ED Triage Notes (Signed)
Pt reports headache x3 days. Difficulty finding words x3days and nausea as well. Hx of migraines but pt reports this is different. Hx anemia and osteomyelitis.  ?

## 2021-08-13 NOTE — ED Provider Notes (Signed)
?[facility] DEPT ?Provider Note ? ? ?CSN: [id number] ?Arrival date & time: 08/13/21  1815 ? ?  ? ?History ? ?Chief Complaint  ?Patient presents with  ? Headache  ? ? ?MRY LAMIA is a 61 y.o. female. ? ?The history is provided by the patient and medical records. No language interpreter was used.  ?Headache ?Pain location:  Generalized ?Radiates to:  Does not radiate ?Pain severity now: severe. ?Pain scale at highest: severe. ?Onset quality:  Gradual ?Duration:  3 days ?Timing:  Constant ?Progression:  Unchanged ?Chronicity:  New ?Relieved by:  Nothing ?Worsened by:  Nothing ?Associated symptoms: abdominal pain, fatigue, numbness (at baseline) and weakness (at baseline)   ?Associated symptoms: no back pain, no congestion, no cough, no diarrhea, no dizziness, no fever, no nausea, no near-syncope, no neck pain, no neck stiffness, no seizures, no tingling, no visual change and no vomiting   ? ?  ? ?Home Medications ?Prior to Admission medications   ?Not on File  ?   ? ?Allergies    ?Vancomycin   ? ?Review of Systems   ?Review of Systems  ?Constitutional:  Positive for chills and fatigue. Negative for fever.  ?HENT:  Negative for congestion.   ?Eyes:  Negative for visual disturbance.  ?Respiratory:  Negative for cough, chest tightness, shortness of breath and wheezing.   ?Cardiovascular:  Negative for chest pain, palpitations, leg swelling and near-syncope.  ?Gastrointestinal:  Positive for abdominal pain. Negative for constipation, diarrhea, nausea and vomiting.  ?Genitourinary:  Negative for dysuria, flank pain and frequency.  ?Musculoskeletal:  Negative for back pain, neck pain and neck stiffness.  ?Skin:  Negative for rash and wound.  ?Neurological:  Positive for weakness (at baseline), numbness (at baseline) and headaches. Negative for dizziness, seizures and light-headedness.  ?Psychiatric/Behavioral:  Negative for agitation and confusion.   ? ?Physical Exam ?Updated Vital Signs ?BP  118/73 (BP Location: Left Arm)   Pulse (!) 103   Temp 97.8 ?F (36.6 ?C) (Oral)   Resp 18   SpO2 94%  ?Physical Exam ?Vitals and nursing note reviewed.  ?Constitutional:   ?   General: She is not in acute distress. ?   Appearance: She is well-developed. She is not ill-appearing, toxic-appearing or diaphoretic.  ?HENT:  ?   Head: Normocephalic and atraumatic.  ?   Mouth/Throat:  ?   Mouth: Mucous membranes are moist.  ?Eyes:  ?   Conjunctiva/sclera: Conjunctivae normal.  ?   Pupils: Pupils are equal, round, and reactive to light.  ?Cardiovascular:  ?   Rate and Rhythm: Normal rate and regular rhythm.  ?   Heart sounds: No murmur heard. ?Pulmonary:  ?   Effort: Pulmonary effort is normal. No respiratory distress.  ?   Breath sounds: Normal breath sounds. No wheezing, rhonchi or rales.  ?Chest:  ?   Chest wall: No tenderness.  ?Abdominal:  ?   General: Abdomen is flat.  ?   Palpations: Abdomen is soft.  ?   Tenderness: There is abdominal tenderness. There is no right CVA tenderness, left CVA tenderness, guarding or rebound.  ?Musculoskeletal:     ?   General: Tenderness present. No swelling.  ?   Cervical back: Neck supple. No tenderness.  ?   Right lower leg: No edema.  ?   Left lower leg: No edema.  ?Skin: ?   General: Skin is warm and dry.  ?   Capillary Refill: Capillary refill takes less than 2 seconds.  ?  Findings: No erythema or rash.  ?Neurological:  ?   General: No focal deficit present.  ?   Mental Status: She is alert.  ?   Sensory: Sensory deficit (at baseline per report) present.  ?   Motor: Weakness (at baseline per report) present.  ?Psychiatric:     ?   Mood and Affect: Mood normal.  ? ? ?ED Results / Procedures / Treatments   ?Labs ?(all labs ordered are listed, but only abnormal results are displayed) ?Labs Reviewed  ?COMPREHENSIVE METABOLIC PANEL - Abnormal; Notable for the following components:  ?    Result Value  ? Glucose, Bld 101 (*)   ? Creatinine, Ser 1.08 (*)   ? Calcium 8.7 (*)   ?  Albumin 3.4 (*)   ? GFR, Estimated 59 (*)   ? All other components within normal limits  ?CULTURE, BLOOD (ROUTINE X 2)  ?CULTURE, BLOOD (ROUTINE X 2)  ?URINE CULTURE  ?TSH  ?CBC WITH DIFFERENTIAL/PLATELET  ?LACTIC ACID, PLASMA  ?LIPASE, BLOOD  ?URINALYSIS, ROUTINE W REFLEX MICROSCOPIC  ? ? ?EKG ?None ? ?Radiology ?CT Head Wo Contrast ? ?Result Date: 08/13/2021 ?CLINICAL DATA:  Initial evaluation for acute headache. EXAM: CT HEAD WITHOUT CONTRAST TECHNIQUE: Contiguous axial images were obtained from the base of the skull through the vertex without intravenous contrast. RADIATION DOSE REDUCTION: This exam was performed according to the departmental dose-optimization program which includes automated exposure control, adjustment of the mA and/or kV according to patient size and/or use of iterative reconstruction technique. COMPARISON:  Comparison made with brain MRI performed on the same day. FINDINGS: Brain: Cerebral volume within normal limits for patient age. No evidence for acute intracranial hemorrhage. No findings to suggest acute large vessel territory infarct. No mass lesion, midline shift, or mass effect. Ventricles are normal in size without evidence for hydrocephalus. No extra-axial fluid collection identified. Vascular: No hyperdense vessel identified. Skull: Scalp soft tissues demonstrate no acute abnormality. Calvarium intact. Sinuses/Orbits: Globes and orbital soft tissues within normal limits. Visualized paranasal sinuses are clear. Trace right mastoid effusion, of doubtful significance. IMPRESSION: Negative head CT. No acute intracranial abnormality. Electronically Signed   By: Jeannine Boga M.D.   On: 08/13/2021 21:50  ? ?MR BRAIN WO CONTRAST ? ?Result Date: 08/13/2021 ?CLINICAL DATA:  Initial evaluation for neuro deficit, stroke suspected. EXAM: MRI HEAD WITHOUT CONTRAST TECHNIQUE: Multiplanar, multiecho pulse sequences of the brain and surrounding structures were obtained without intravenous  contrast. COMPARISON:  Comparison made with CT from the same day. FINDINGS: Brain: Examination mildly degraded by motion artifact. Cerebral volume within normal limits. Few scattered subcentimeter foci of T2/FLAIR hyperintensity noted involving the supratentorial cerebral white matter, nonspecific, but overall minimal in nature, and felt to be within normal limits for age. No evidence for acute or subacute infarct. Gray-white matter differentiation maintained. No areas of chronic cortical infarction. No acute or chronic intracranial blood products. No mass lesion, midline shift or mass effect no hydrocephalus or extra-axial fluid collection. Pituitary gland suprasellar region within normal limits. Vascular: Major intracranial vascular flow voids are maintained. Skull and upper cervical spine: Craniocervical junction normal. Bone marrow signal intensity within normal limits. No scalp soft tissue abnormality. Sinuses/Orbits: Globes and orbital soft tissues within normal limits. Mild chronic mucosal thickening noted about the paranasal sinuses with sequelae of prior sinus surgery. Small right greater than left mastoid effusions. Visualized nasopharynx unremarkable. Other: None. IMPRESSION: Normal brain MRI for age.  No acute intracranial abnormality. Electronically Signed   By: Jeannine Boga  M.D.   On: 08/13/2021 21:30  ? ?CT ABDOMEN PELVIS W CONTRAST ? ?Result Date: 08/13/2021 ?CLINICAL DATA:  Right lower quadrant abdominal pain. EXAM: CT ABDOMEN AND PELVIS WITH CONTRAST TECHNIQUE: Multidetector CT imaging of the abdomen and pelvis was performed using the standard protocol following bolus administration of intravenous contrast. RADIATION DOSE REDUCTION: This exam was performed according to the departmental dose-optimization program which includes automated exposure control, adjustment of the mA and/or kV according to patient size and/or use of iterative reconstruction technique. CONTRAST:  38m OMNIPAQUE IOHEXOL  300 MG/ML  SOLN COMPARISON:  None. FINDINGS: Lower chest: Minimal bibasilar dependent atelectasis. The visualized lung bases are otherwise clear. No intra-abdominal free air or free fluid. Hepatobiliary: Probab

## 2021-08-14 ENCOUNTER — Emergency Department (HOSPITAL_COMMUNITY): Payer: Medicare Other

## 2021-08-14 LAB — URINALYSIS, ROUTINE W REFLEX MICROSCOPIC
Bilirubin Urine: NEGATIVE
Glucose, UA: NEGATIVE mg/dL
Hgb urine dipstick: NEGATIVE
Ketones, ur: NEGATIVE mg/dL
Leukocytes,Ua: NEGATIVE
Nitrite: NEGATIVE
Protein, ur: NEGATIVE mg/dL
Specific Gravity, Urine: >1.046 — ABNORMAL HIGH (ref 1.005–1.030)
pH: 5 (ref 5.0–8.0)

## 2021-08-14 IMAGING — MR MR FEMUR*R* WO/W CM
7 of 8 series · 35 of 40 positions shown · IV contrast (gadavist)
Comparison: CT [DATE]

CLINICAL DATA: Possible osteomyelitis

EXAM:
MRI OF THE RIGHT FEMUR WITHOUT AND WITH CONTRAST
TECHNIQUE: Multiplanar, multisequence MR imaging of the right femur was
performed both before and after administration of intravenous
contrast.
CONTRAST:  9mL GADAVIST GADOBUTROL 1 MMOL/ML IV SOLN

[Series 3: T1 · coronal · right · 4.5mm · 1.72mm/px · 4 of 30 slices shown (1 of 2)]
[im 1/30]
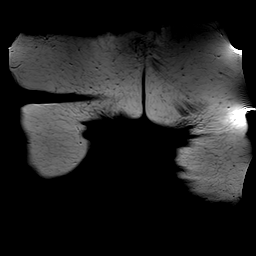
[im 10/30]
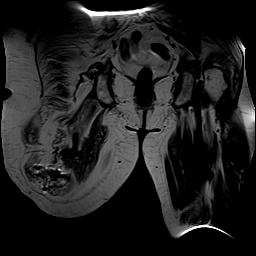
[im 20/30]
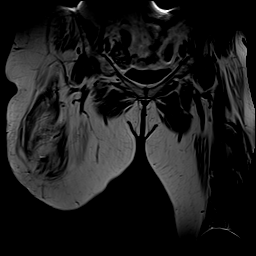
[im 30/30]
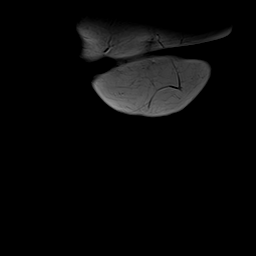

[Series 4: STIR · coronal · right · 4.5mm · 1.72mm/px · 4 of 30 slices shown (1 of 2)]
[im 1/30]
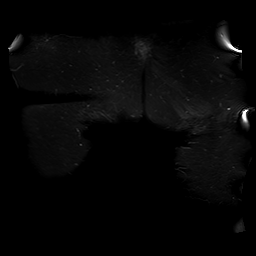
[im 10/30]
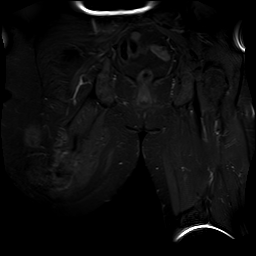
[im 20/30]
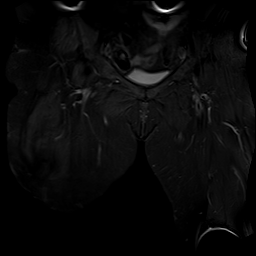
[im 30/30]
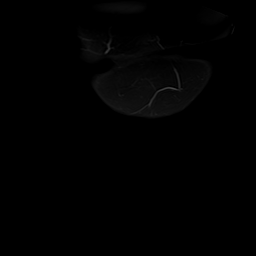

[Series 5: T1 · axial · right · 5.5mm · 1.02mm/px · z∈[-147,+164]mm · 6 of 40 slices shown (2 of 2)]
[im 1/40]
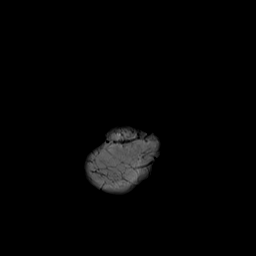
[im 8/40]
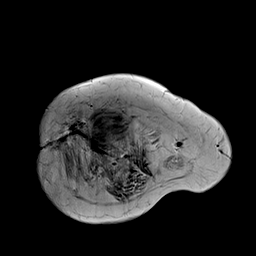
[im 16/40]
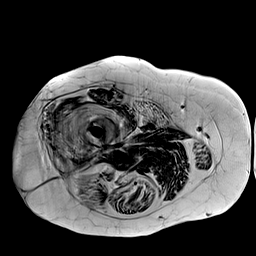
[im 24/40]
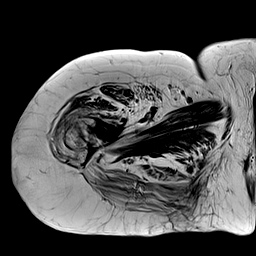
[im 32/40]
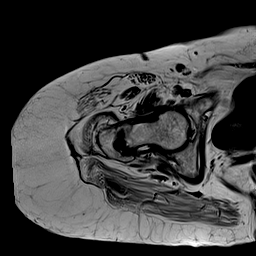
[im 40/40]
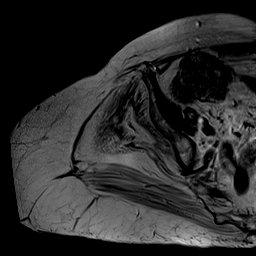

[Series 6: T2 fat-sat · axial · right · 5.5mm · 1.02mm/px · z∈[-147,+164]mm · 6 of 40 slices shown]
[im 1/40]
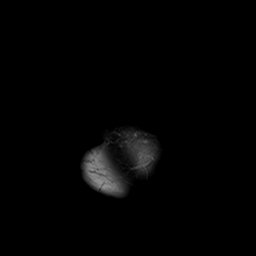
[im 8/40]
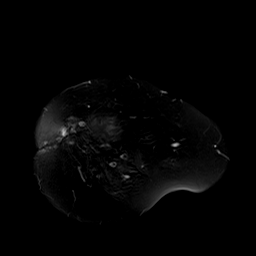
[im 16/40]
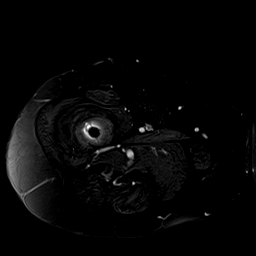
[im 24/40]
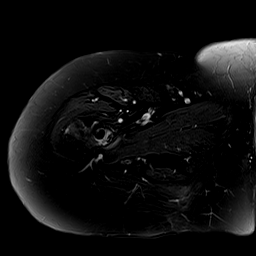
[im 32/40]
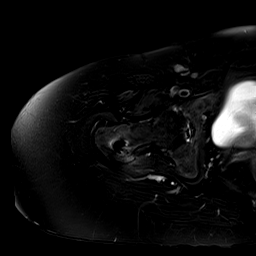
[im 40/40]
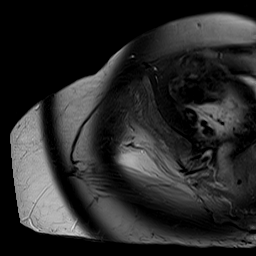

[Series 7: pre axial ti · axial · non-contrast · right · 5.5mm · 0.51mm/px · z∈[-147,+164]mm · 6 of 40 slices shown]
[im 1/40]
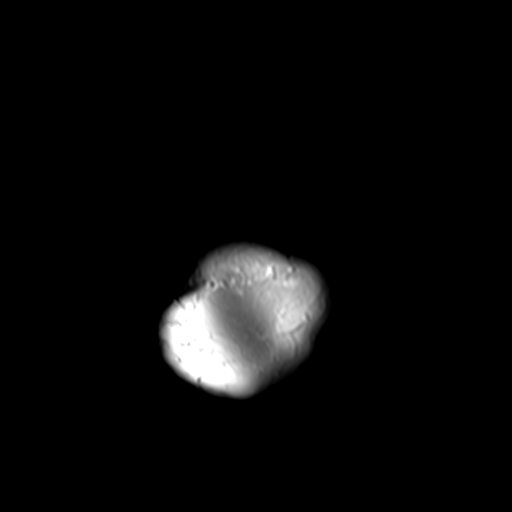
[im 8/40]
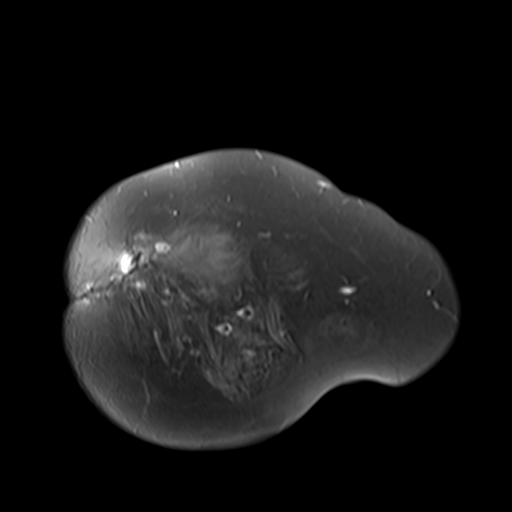
[im 16/40]
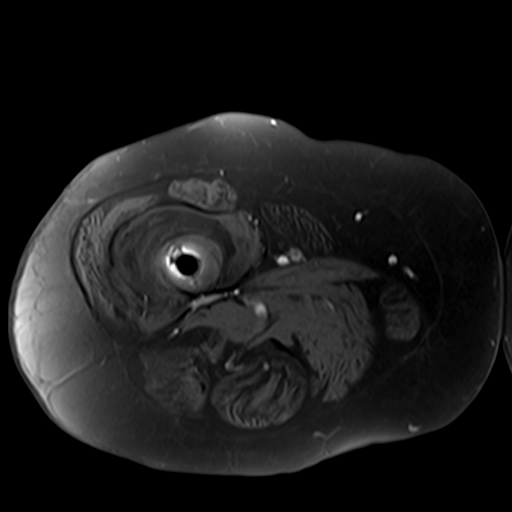
[im 24/40]
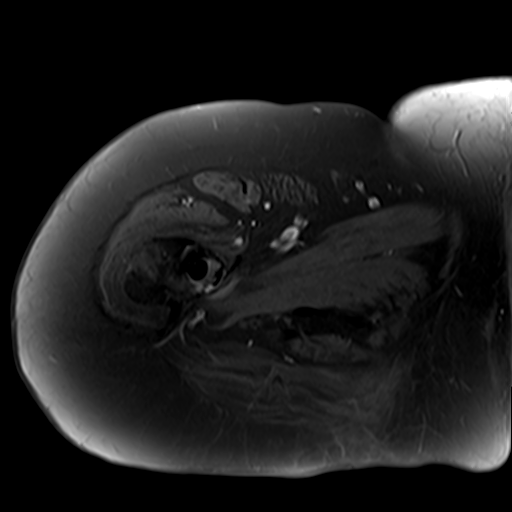
[im 32/40]
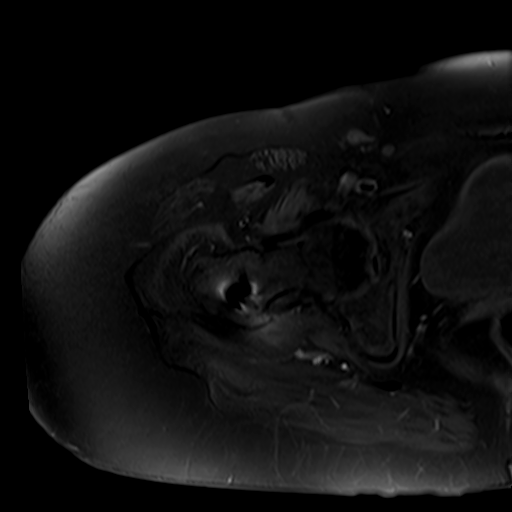
[im 40/40]
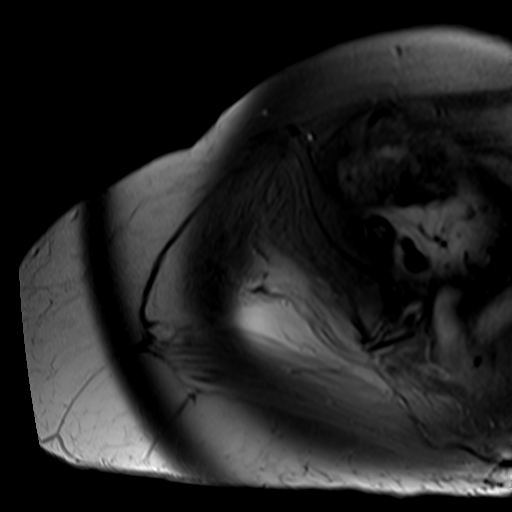

[Series 8: STIR · sagittal · right · 5.0mm · 1.33mm/px · 4 of 30 slices shown (2 of 2)]
[im 1/30]
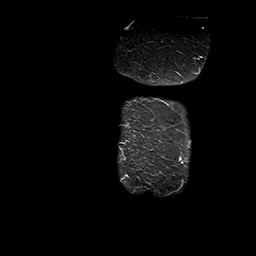
[im 10/30]
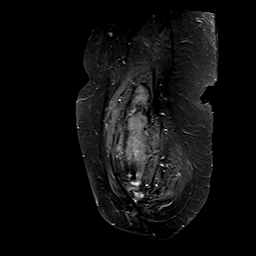
[im 20/30]
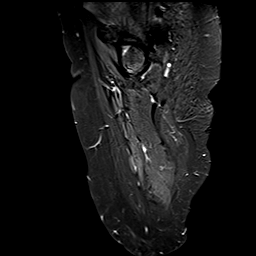
[im 30/30]
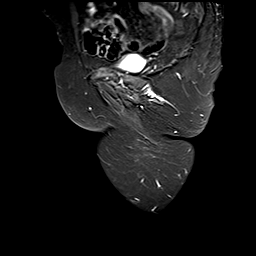

[Series 9: post axial ti · axial · right · 5.5mm · 0.51mm/px · z∈[-161,+95]mm · 5 of 40 slices shown]
[im 1/40]
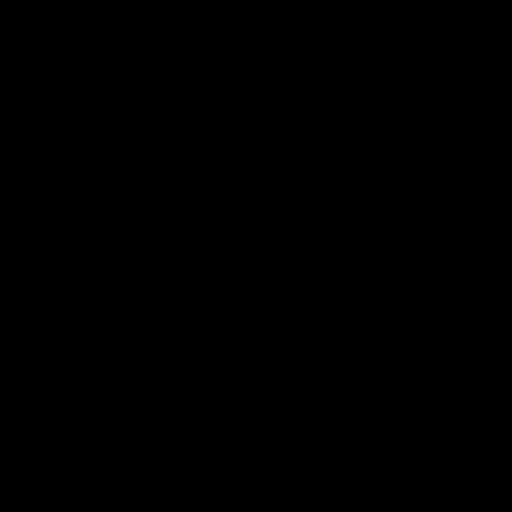
[im 8/40]
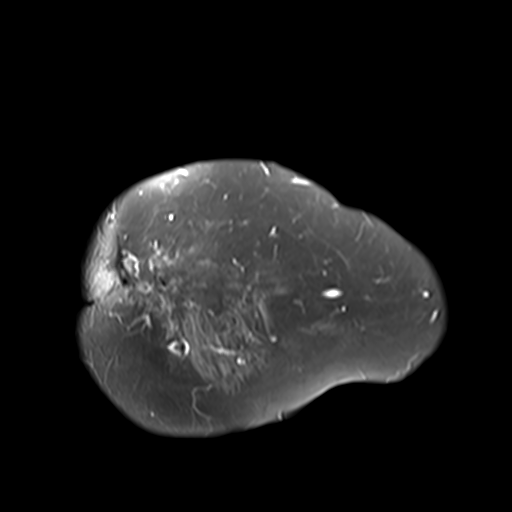
[im 16/40]
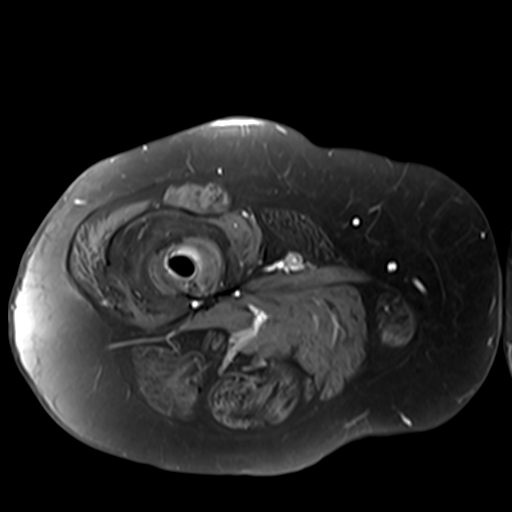
[im 24/40]
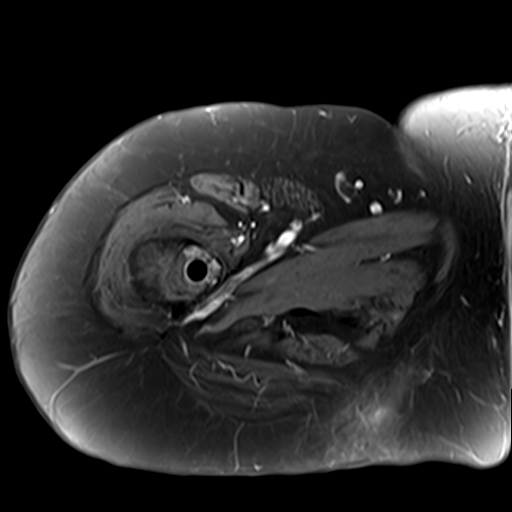
[im 32/40]
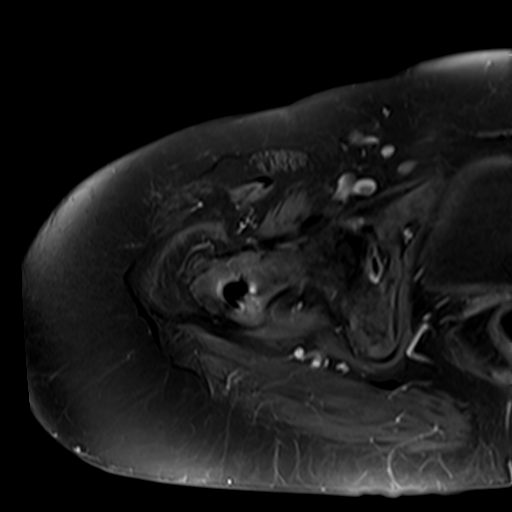

[35 of 40 positions shown; findings below may reference images not displayed]

FINDINGS: Postsurgical changes of above knee amputation. There is
intramedullary hardware which appears fractured in the midportion
with slight angulation, as seen on recent radiograph and CT. There
is adjacent susceptibility artifact along the hardware. There is a
fracture deformity of the subtrochanteric femur with prominent
callus formation, with adjacent mild marrow signal abnormality
without any adjacent soft tissue changes, this is all likely
chronic.

At the distal stump, there is a 2.3 x 0.8 by 3.0 cm rim enhancing
collection, as seen on recent CT. There are no adjacent marrow
changes to suggest osteomyelitis. There is mild bony fragmentation
as seen on recent CT.

Generalized muscle atrophy. There are multiple small punctate foci
of susceptibility artifact reflecting postsurgical change.
IMPRESSION: 2.3 x 0.8 x 3.0 cm rim enhancing collection adjacent to the distal
stump as seen on recent CT. No adjacent marrow signal abnormality to
suggest osteomyelitis. This collection could represent seroma,
hematoma, or abscess, but favor non-infectious etiology given there
are no adjacent inflammatory changes.

Fracture deformity of the subtrochanteric femur with probable
chronic adjacent marrow change and fracture of intramedullary
hardware.

## 2021-08-14 MED ORDER — TRAZODONE HCL 100 MG PO TABS
50.0000 mg | ORAL_TABLET | Freq: Every evening | ORAL | Status: DC | PRN
  Administered 2021-08-14: 50 mg via ORAL
  Filled 2021-08-14: qty 1

## 2021-08-14 MED ORDER — HYDROXYZINE HCL 25 MG PO TABS
25.0000 mg | ORAL_TABLET | Freq: Three times a day (TID) | ORAL | Status: DC | PRN
  Administered 2021-08-14: 25 mg via ORAL
  Filled 2021-08-14: qty 1

## 2021-08-14 MED ORDER — HYDROMORPHONE HCL 1 MG/ML IJ SOLN
0.5000 mg | Freq: Once | INTRAMUSCULAR | Status: AC
  Administered 2021-08-14: 0.5 mg via INTRAVENOUS
  Filled 2021-08-14: qty 1

## 2021-08-14 MED ORDER — NICOTINE 14 MG/24HR TD PT24
14.0000 mg | MEDICATED_PATCH | Freq: Once | TRANSDERMAL | Status: DC
  Filled 2021-08-14: qty 1

## 2021-08-14 MED ORDER — HYDROMORPHONE HCL 1 MG/ML IJ SOLN
0.5000 mg | Freq: Once | INTRAMUSCULAR | Status: AC
Start: 2021-08-14 — End: 2021-08-14
  Administered 2021-08-14: 0.5 mg via INTRAVENOUS
  Filled 2021-08-14: qty 1

## 2021-08-14 NOTE — ED Notes (Signed)
Patient started scratching her legs and is making herself bleed.  ?

## 2021-08-14 NOTE — ED Provider Notes (Signed)
Signed out to dr Kathrynn Humble at shift change with MRI pending.  If negative she can be discharged ?  ?Ripley Fraise, MD ?08/14/21 [phone] ? ?

## 2021-08-14 NOTE — ED Notes (Signed)
Patient refused nicotine patch and stated I can just go smoke outside.  ?

## 2021-08-14 NOTE — ED Notes (Signed)
Pt refusing vital sign recheck ?

## 2021-08-14 NOTE — Discharge Instructions (Signed)
Please follow-up with orthopedic surgery for management of your stump. ? ?It is very important that you continue to follow-up with 1 specialist, so that appropriate management can be completed and the work-up is more comprehensive. ? ?Return to the ER if your symptoms get worse. ?

## 2021-08-14 NOTE — ED Notes (Addendum)
Attempted to update vital signs but patient stated she needed some privacy and to come back later.  ?

## 2021-08-14 NOTE — ED Notes (Signed)
Patient left her room and smoke outside. Patient informed she can't do that. ?

## 2021-08-14 NOTE — ED Notes (Signed)
Pt has been uncooperative and argumentative w/ staff.  Will attempt to get vitals again shortly.  ?

## 2021-08-14 NOTE — ED Provider Notes (Signed)
I assumed care at signout to follow-up on MRI.  MRI of the extremity results is delayed at this time.  On my assessment, patient is very anxious and flailing around the bed.  She reports she is itching throughout her body and usually can tolerate hydroxyzine.  She is also requesting her nighttime trazodone.  She is also requesting to go outside for "just 5 minutes" to smoke a cigarette.  Advised we can provide a nicotine patch.  MRI extremity is pending at this time, rest of her work-up was unremarkable ?  ?Ripley Fraise, MD ?08/14/21 0234 ? ?

## 2021-08-14 NOTE — ED Provider Notes (Addendum)
?Physical Exam  ?BP 99/68   Pulse 85   Temp 97.8 ?F (36.6 ?C) (Oral)   Resp 15   SpO2 98%  ? ?Physical Exam ? ?Procedures  ?Procedures ? ?ED Course / MDM  ?  ?Medical Decision Making ?Amount and/or Complexity of Data Reviewed ?Labs: ordered. ?Radiology: ordered. ? ?Risk ?OTC drugs. ?Prescription drug management. ? ? ?Assuming care of patient from Dr. Christy Gentles. ?  ?Patient in the ED for headache and confusion with some leg pain. ?Workup thus far shows normal MRI and CT scans. ?MRI femur ordered to ensure there is no osteomyelitis ? ?Dispo - per MRI. ? ?  ?Varney Biles, MD ?08/14/21 0715 ? ? ?9:00 AM ?MRI results indicate that patient has likely seroma.  The fluid collection most likely seroma as there is no inflammatory changes around it, indicating abscess. ?Also there is some evidence of bone marrow erosion and hardware changes, but none of them are acute.  There is no acute osteomyelitis for which MRI was ordered. ? ?I discussed the case with patient.  She denies any new fevers, chills.  Indicates that her pain has been getting worse over the course of several months.  She also states that the swelling has been present for 6 weeks. ? ?Patient indicates that her initial surgery was done at [facility].  She is new to this area.  I asked her if she has a PCP in [place], she indicated that she was recently discharged from a nursing home at [facility].  I asked her about what led to admission at nursing home, patient was not able to give me a clear history regarding that. ? ?On care everywhere, it appears that patient did see orthopedic surgery at [facility] in January 2023.  Their recommendation is as following: ? ?PLAN:  ?- Orthotic script given for stump shrinker and prosthetics ?- Recommend PCP for BLE swelling, pain ?- Recommend spine consult for BUE pain, numbness and LLE pain and weakness ?- Rehabilitation strategies: Activity modification, avoid provacative movements, ice, elevate, OTC pain meds per  OTC instructions, gentle ROM and stretching exercises.  ?- PT/OT: continue ?- Work Recommendations : na ?- Medications: No new medications added or changed. ?- Education: I reviewed the pertinent anatomy as well as potential etiology and treatment of symptoms. Patient expressed understanding and all questions were answered during today's visit. ?- Follow up: as needed if symptoms worsen or fail to improve.   ? ? ? ?Prior to that, she had encounters with orthopedist at [place] [facility] [place].  Last MRI was 07-2020: ?IMPRESSION  ?Significant interim improvement in the amount of enhancing material surrounding the cement spacer within the proximal femur.  ? ? ?On 12-2020 she was seen by orthopedist at [facility].  At that time her concerns were lower extremity pain, swelling and abdominal decree sensation.  Symptoms similar to what she is having right now.  Their impression and plan is as following: ? ?Impression  ? ?1. Chronic osteomyelitis of hip (*)  ?2. Chronic pain of left ankle  ?3. Radiculopathy, unspecified spinal region  ? ?Plan  ? ?I had a long discussion with the patient reviewing her history, physical exam, and imaging findings. Her symptoms are somewhat confusing given their distribution and quality and in the setting of her relatively normal-appearing imaging studies. I have advised further imaging of the cervical spine as pathology in this region may be contributing to her symptoms. We will order this today. I have also advised consultation with my colleague  Dr. Elta Guadeloupe Reding in neurosurgery. I have personally discussed the case with him. The patient otherwise return to see me as necessary. ? ?Date of Surgery: ?1. Right total knee arthroplasty, 2009 (Dr. Tawny Hopping) ?2. Right knee I&D for acute PJI, 2009  ?3. Antibiotic spacer, right knee x 2  ?4. Revision right TKA with OSS hinge, 02/2011 (Dr. Woodfin Ganja, [place]) ?5. Above knee amputation  ?6. Revision above knee amputation, 07/2018 Nilda Simmer) ? ? ?When I asked  patient specifics about her orthopedic outpatient follow-up, she is very vague.  She indicates that [facility].  When I mention to her that there is an encounter on epic, she indicates that that was a one-time visit and it was useless.  I asked her about her follow-up with physicians at [facility], and she repeats that " those doctors were not specialized enough". ? ?I have reviewed patient's chart extensively.  She has stable vital signs, normal white count, MRI that is normal besides evidence of seroma, that is probably a sequelae of chronic issues and not an acute finding.  No evidence of abscess.  I have consulted orthopedic doctor to see if anything else needs to happen acutely, but my suspicion is low.  I have not heard back from Dr. Rolena Infante, who is covering orthopedic surgery at this time. ? ?She will need outpatient follow-up.  She does not want to follow-up at [facility], therefore we will give her the consultation information for [facility] health. ? ?The stump itself has no erythema, warmth to touch.  It is edematous, there is no focal tenderness to palpation with distraction. ? ?Towards the end, patient started becoming very defensive, and started accusing me of not knowing what I am doing.  She started cursing in the room, at which time I excused myself.  I have informed her again prior to leaving that she can continue following up with [facility], who is seen her or follow-up with [facility].  She again indicates that she would want a new specialist, and therefore: Follow-up information has been provided. ? ? ? ? ? ? ? ?  ?Varney Biles, MD ?08/14/21 0915 ? ?  ?Received a call from Dr. Rolena Infante at 10:13 AM. ?He indicated that he is not a stump specialist.  His recommendation would be that patient follows up with initial care team.  I informed Dr. Rolena Infante that patient has left the ER now, and that patient might call [facility] for a follow-up, in which case they can send her  to the appropriate clinician within their practice to help with stump. ?  ?Varney Biles, MD ?08/14/21 1014 ? ?

## 2021-08-14 NOTE — ED Notes (Signed)
Pt refused to discharge vitals and to sign for discharge.  Pt verbalized understanding of discharge instructions.  Pt wheeled to lobby by EMT.  Sts someone is coming to get her.   ?

## 2021-08-14 NOTE — ED Notes (Addendum)
Patient keep scratching her hand and arms until it bleeds. RN apply gauze and kerlix to stop her from scratching and further damage her skin. Patient stated "you're not helping me in anyway. The medicine that you gave me is not working. ?

## 2021-08-15 LAB — URINE CULTURE

## 2021-08-18 ENCOUNTER — Encounter: Payer: Self-pay | Admitting: Family Medicine

## 2021-08-18 ENCOUNTER — Ambulatory Visit (INDEPENDENT_AMBULATORY_CARE_PROVIDER_SITE_OTHER): Payer: Medicare Other | Admitting: Family Medicine

## 2021-08-18 ENCOUNTER — Encounter: Payer: Self-pay | Admitting: *Deleted

## 2021-08-18 VITALS — BP 122/80 | HR 84 | Temp 98.1°F | Ht 67.0 in | Wt 197.1 lb

## 2021-08-18 DIAGNOSIS — Z89611 Acquired absence of right leg above knee: Secondary | ICD-10-CM | POA: Diagnosis not present

## 2021-08-18 DIAGNOSIS — R21 Rash and other nonspecific skin eruption: Secondary | ICD-10-CM

## 2021-08-18 DIAGNOSIS — G894 Chronic pain syndrome: Secondary | ICD-10-CM | POA: Diagnosis not present

## 2021-08-18 DIAGNOSIS — R202 Paresthesia of skin: Secondary | ICD-10-CM

## 2021-08-18 DIAGNOSIS — H16002 Unspecified corneal ulcer, left eye: Secondary | ICD-10-CM

## 2021-08-18 LAB — CULTURE, BLOOD (ROUTINE X 2)
Culture: NO GROWTH
Culture: NO GROWTH
Special Requests: ADEQUATE

## 2021-08-18 MED ORDER — HYDROMORPHONE HCL 2 MG PO TABS: 2.0000 mg | ORAL_TABLET | Freq: Four times a day (QID) | ORAL | 0 refills | Status: DC | PRN

## 2021-08-18 MED ORDER — DULOXETINE HCL 60 MG PO CPEP: 60.0000 mg | ORAL_CAPSULE | Freq: Two times a day (BID) | ORAL | 0 refills | Status: DC

## 2021-08-18 MED ORDER — BACLOFEN 10 MG PO TABS: 10.0000 mg | ORAL_TABLET | Freq: Three times a day (TID) | ORAL | 0 refills | Status: DC

## 2021-08-18 NOTE — Patient Instructions (Signed)
Welcome to [facility] at [facility] [place]! It was a pleasure meeting you today.  As discussed, Please schedule a 1 month follow up visit today.  PLEASE NOTE:  If you had any LAB tests please let us know if you have not heard back within a few days. You may see your results on MyChart before we have a chance to review them but we will give you a call once they are reviewed by us. If we ordered any REFERRALS today, please let us know if you have not heard from their office within the next week.  Let us know through MyChart if you are needing REFILLS, or have your pharmacy send us the request. You can also use MyChart to communicate with me or any office staff.  Please try these tips to maintain a healthy lifestyle:  Eat most of your calories during the day when you are active. Eliminate processed foods including packaged sweets (pies, cakes, cookies), reduce intake of potatoes, white bread, white pasta, and white rice. Look for whole grain options, oat flour or almond flour.  Each meal should contain half fruits/vegetables, one quarter protein, and one quarter carbs (no bigger than a computer mouse).  Cut down on sweet beverages. This includes juice, soda, and sweet tea. Also watch fruit intake, though this is a healthier sweet option, it still contains natural sugar! Limit to 3 servings daily.  Drink at least 1 glass of water with each meal and aim for at least 8 glasses per day  Exercise at least 150 minutes every week.   
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
## 2021-08-19 ENCOUNTER — Telehealth: Payer: Self-pay

## 2021-08-19 ENCOUNTER — Encounter: Payer: Self-pay | Admitting: Family Medicine

## 2021-08-19 ENCOUNTER — Ambulatory Visit (INDEPENDENT_AMBULATORY_CARE_PROVIDER_SITE_OTHER): Payer: Medicare Other | Admitting: Family Medicine

## 2021-08-19 ENCOUNTER — Other Ambulatory Visit: Payer: Self-pay | Admitting: *Deleted

## 2021-08-19 VITALS — BP 124/80 | HR 100 | Temp 98.4°F | Ht 67.0 in | Wt 197.0 lb

## 2021-08-19 DIAGNOSIS — R110 Nausea: Secondary | ICD-10-CM

## 2021-08-19 DIAGNOSIS — R519 Headache, unspecified: Secondary | ICD-10-CM

## 2021-08-19 DIAGNOSIS — R202 Paresthesia of skin: Secondary | ICD-10-CM | POA: Diagnosis not present

## 2021-08-19 DIAGNOSIS — R42 Dizziness and giddiness: Secondary | ICD-10-CM

## 2021-08-19 MED ORDER — AMOXICILLIN-POT CLAVULANATE 875-125 MG PO TABS: 1.0000 | ORAL_TABLET | Freq: Two times a day (BID) | ORAL | 0 refills | Status: DC

## 2021-08-19 MED ORDER — KETOROLAC TROMETHAMINE 60 MG/2ML IM SOLN
60.0000 mg | Freq: Once | INTRAMUSCULAR | Status: AC
  Administered 2021-08-19: 60 mg via INTRAMUSCULAR

## 2021-08-19 MED ORDER — ONDANSETRON HCL 4 MG PO TABS: 4.0000 mg | ORAL_TABLET | Freq: Three times a day (TID) | ORAL | 0 refills | Status: DC | PRN

## 2021-08-19 MED ORDER — ONDANSETRON HCL 4 MG PO TABS: 4.0000 mg | ORAL_TABLET | Freq: Three times a day (TID) | ORAL | 1 refills | Status: AC | PRN

## 2021-08-19 MED ORDER — PROMETHAZINE HCL 25 MG/ML IJ SOLN
25.0000 mg | Freq: Once | INTRAMUSCULAR | Status: AC
  Administered 2021-08-19: 25 mg via INTRAMUSCULAR

## 2021-08-19 NOTE — Progress Notes (Signed)
? ?Subjective:  ? ? ? Patient ID: Renee Pitts, female    DOB: 1960-06-15, 61 y.o.   MRN: [id number] ? ?Chief Complaint  ?Patient presents with  ? Headache  ?  Pt c/o headaches and dizziness for a week since Sunday.  ? ? ?HPI ?Dizzy, ha, nausea for 1 wk   was in ER-got pain meds, etc.  Constant nausea.   Irritable. Had to keep eyes closed on drive ?Only taking pain meds 2-3x/day.  Occ 4x/d.  Doesn't go days w/med.    Took zofran this am.  ?Has appt w/her ENT in [facility] on 4/11 ? ?Living in [place] in apt for now.   ? ?Health Maintenance Due  ?Topic Date Due  ? HIV Screening  Never done  ? Hepatitis C Screening  Never done  ? TETANUS/TDAP  Never done  ? Zoster Vaccines- Shingrix (1 of 2) Never done  ? PAP SMEAR-Modifier  Never done  ? COLONOSCOPY (Pts 45-18yr Insurance coverage will need to be confirmed)  Never done  ? MAMMOGRAM  Never done  ? ? ?Past Medical History:  ?Diagnosis Date  ? Allergy   ? Anxiety   ? Arthritis   ? Blood transfusion without reported diagnosis   ? Cancer ([facility]   ? Skin  ? [place]   ? Depression   ? GERD (gastroesophageal reflux disease)   ? Sleep apnea   ? Tuberculosis   ? Skin test posituve only  ? ? ?Past Surgical History:  ?Procedure Laterality Date  ? ABDOMINAL HYSTERECTOMY    ? endometriosis  ? APPENDECTOMY    ? BREAST SURGERY    ? CHOLECYSTECTOMY    ? COLON SURGERY    ? "twisted colon"  ? FRACTURE SURGERY    ? JOINT REPLACEMENT Right   ? mult R knee  ? LEG AMPUTATION THROUGH FEMUR Right   ? ? ?Outpatient Medications Prior to Visit  ?Medication Sig Dispense Refill  ? baclofen (LIORESAL) 10 MG tablet Take 1 tablet (10 mg total) by mouth 3 (three) times daily. 90 each 0  ? butalbital-acetaminophen-caffeine (FIORICET) 50-325-40 MG tablet Take 1 tablet by mouth every 6 (six) hours as needed for headache or migraine.    ? DULoxetine (CYMBALTA) 60 MG capsule Take 1 capsule (60 mg total) by mouth 2 (two) times daily. 180 capsule 0  ? erythromycin  ophthalmic ointment Place 1 application. into the left eye at bedtime.    ? HYDROmorphone (DILAUDID) 2 MG tablet Take 1 tablet (2 mg total) by mouth 4 (four) times daily as needed (for pain). 90 tablet 0  ? hydrOXYzine (ATARAX) 25 MG tablet Take 25 mg by mouth 3 (three) times daily as needed for itching.    ? ondansetron (ZOFRAN) 4 MG tablet Take 1 tablet (4 mg total) by mouth every 8 (eight) hours as needed for nausea or vomiting. 30 tablet 1  ? pramipexole (MIRAPEX) 1 MG tablet Take 1 mg by mouth 2 (two) times daily.    ? prednisoLONE acetate (PRED FORTE) 1 % ophthalmic suspension Place 1 drop into the left eye 3 (three) times daily.    ? pregabalin (LYRICA) 150 MG capsule Take 150 mg by mouth 3 (three) times daily.    ? traZODone (DESYREL) 50 MG tablet Take 50 mg by mouth at bedtime as needed for sleep.    ? ?No facility-administered medications prior to visit.  ? ? ?Allergies  ?Allergen Reactions  ? Vancomycin Anaphylaxis and Shortness Of  Breath  ? Metoclopramide Other (See Comments)  ?  Reaction not recalled  ? Prochlorperazine Other (See Comments)  ?  Agitation, Mental Status Changes, and increased RLS symptoms  ? Benadryl [Diphenhydramine] Other (See Comments)  ?  "Hypes me up and causes my legs to go into motion very badly"  ? Doxycycline   ? Morphine Hives and Other (See Comments)  ?  Ineffective, also  ? ?ROS Ortho has told her in past that may have to disarticulate hip but trying to avoid.  ?N/t/pain leg and hands.   ? ? ?   ?Objective:  ?  ? ?BP 124/80   Pulse 100   Temp 98.4 ?F (36.9 ?C)   Ht '5\' 7"'$  (1.702 m)   Wt 197 lb (89.4 kg)   SpO2 95%   BMI 30.85 kg/m?  ?Wt Readings from Last 3 Encounters:  ?08/19/21 197 lb (89.4 kg)  ?08/18/21 197 lb 2 oz (89.4 kg)  ?07/27/21 190 lb (86.2 kg)  ? ? ?Physical Exam  ? ?Gen: WDWN in pain ?HEENT: NCAT, conjunctiva not injected, sclera nonicteric-opaqueness L eye. ?Holes in TM B.  No d/c ?MSK: R AKA.  In w/c ?NEURO: A&O x3.  CN II-XII intact. Dizzy but no  nystagmus ?PSYCH: normal mood. Keeps eyes closed ? ?Reviewed MRI ? ?   ?Assessment & Plan:  ? ?Problem List Items Addressed This Visit   ?None ?Visit Diagnoses   ? ? Acute intractable headache, unspecified headache type    -  Primary  ? Relevant Medications  ? ketorolac (TORADOL) injection 60 mg (Completed)  ? promethazine (PHENERGAN) injection 25 mg (Completed)  ? Paresthesia      ? Relevant Orders  ? Ambulatory referral to Neurology  ? ?  ? HA/dizziness/nausea-MRI,labs ok.  Not sure if vertigo, coming from corneal ulcer, other.  Given toradol and phenergan.   Fill zofran.  Already on dilauded.  Take tylenol.   Declines prednisone.  Will also do augmenting in case mastoiditis acute vs chronic.  Referral in place for ENT-pt will keep appt w/one in [place] but in process of completing move to [place].  ?Paresthesias hands/L leg-had reviewed records.  Neurosurg rec referral to neurologist-nothing done w/[facility].  Wants locally.  Will refer.  ? ?Meds ordered this encounter  ?Medications  ? ketorolac (TORADOL) injection 60 mg  ? promethazine (PHENERGAN) injection 25 mg  ? amoxicillin-clavulanate (AUGMENTIN) 875-125 MG tablet  ?  Sig: Take 1 tablet by mouth 2 (two) times daily.  ?  Dispense:  20 tablet  ?  Refill:  0  ? ? ?Wellington Hampshire, MD ? ?

## 2021-08-19 NOTE — Telephone Encounter (Signed)
Patient notified and verbalized understanding. Patient has scheduled a visit for this afternoon. ?

## 2021-08-19 NOTE — Telephone Encounter (Signed)
Patient stated that she is having a lot of nausea as soon as she get up out the bed, having a hard time riding in the car due to a headache she has had for about a week that she went to the ER for recently. She would like Zofran 4 mg tablets sent to the pharmacy. Would like to see ENT because she think it is vertigo, both of her eardrums are busted. Patient questioned if her eye issue could be causing the headache, nausea, vertigo. Please advise.  ?

## 2021-08-19 NOTE — Patient Instructions (Addendum)
Take tylenol for pain.  Zofran for nausea.   Augmentin in case mastoids infected.     ?See ENT.  ? ?ER if worse ?

## 2021-08-19 NOTE — Addendum Note (Signed)
Addended by: Zacarias Pontes on: 08/19/2021 01:54 PM ? ? Modules accepted: Orders ? ?

## 2021-08-19 NOTE — Telephone Encounter (Signed)
Patient wanted to see if Dr. Cherlynn Kaiser would prescribed her zofran. Patient wanted to see if we could send a referral to a ENT.   ?

## 2021-08-20 ENCOUNTER — Telehealth: Payer: Self-pay

## 2021-08-20 NOTE — Telephone Encounter (Signed)
Patient notified that PCP is gone for the day. Patient advised that it may take a little while for medication to get in her system. Patient verbalized understanding and stated that her Zofran isn't working and that the shot she received yesterday did help some.  ?

## 2021-08-20 NOTE — Telephone Encounter (Signed)
States patient received an injection in our office yesterday 08/20/21 for nausea.  States patient has nausea medication but is not working.  Wanted to know if patient could come back in to get another injection for nausea?

## 2021-08-24 NOTE — Telephone Encounter (Signed)
Called and lm on pt vm to f/u with her and give her the below message, advised pt tcb and let us know how she is feeling. ?

## 2021-08-25 ENCOUNTER — Encounter: Payer: Self-pay | Admitting: Family Medicine

## 2021-08-25 MED ORDER — BUTALBITAL-APAP-CAFFEINE 50-325-40 MG PO TABS
1.0000 | ORAL_TABLET | Freq: Four times a day (QID) | ORAL | 0 refills | Status: DC | PRN
Start: 2021-08-25 — End: 2021-08-31

## 2021-08-25 NOTE — Telephone Encounter (Signed)
Last refills by historical provider  ?

## 2021-08-25 NOTE — Telephone Encounter (Signed)
Request send to PCP  ?

## 2021-08-27 ENCOUNTER — Telehealth: Payer: Self-pay | Admitting: Family Medicine

## 2021-08-27 NOTE — Telephone Encounter (Signed)
FYI

## 2021-08-27 NOTE — Telephone Encounter (Signed)
Triage nurse called back after getting patient triaged. Patient is to be seen by PCP for severe diarrhea within 4 hrs. - Office does not have any openings.- Patient was advised by nurse to go to ED or urgent care to be seen.  ? ?  ?

## 2021-08-27 NOTE — Telephone Encounter (Signed)
Pt was advised to go to ED or urgent care due to no available appts. ? ?Patient ?Name: ?Renee ?Pitts ?Gender: Female ?DOB: 1961/02/24 ?Age: 61 Y 2 M 7 D ?Return ?Phone ?Number: ?[id number] ?(Primary), ?[id number] ?(Secondary) ?Address: ?City/ ?State/ ?Zip: ?[place] ? [address] ?Client [place] [facility] [place] [facility] - ?Client ?Presenter, broadcasting at [place] [facility] ?Contact Type Call ?Who Is Calling Patient / Member / Family / Caregiver ?Call Type Triage / Clinical ?Relationship To Patient Self ?Return Phone Number [phone] (Primary) ?Chief Complaint Abdominal Pain ?Reason for Call Symptomatic / Request for Health Information ?Initial Comment Caller states she has watery stool, headaches, and ?abdominal pain. She's had these symptoms for ?over a week. She sees Dr. Carmon Sails (not ?listed). Call came from the office. ?Translation No ?Nurse Assessment ?Nurse: Doyle Askew, RN, Renee Pitts Date/Time Eilene Ghazi Time): 08/27/2021 2:32:07 PM ?Confirm and document reason for call. If ?symptomatic, describe symptoms. ?---Caller states she has watery stool, headaches, ?and abdominal pain. Caller states she has had these ?symptoms for over a week. She sees Dr. Reggy Eye ?Cherlynn Kaiser (not listed). Call came from the office. ?Does the patient have any new or worsening ?symptoms? ---Yes ?Will a triage be completed? ---Yes ?Related visit to physician within the last 2 weeks? ---Yes ?Does the PT have any chronic conditions? (i.e. ?diabetes, asthma, this includes High risk factors for ?pregnancy, etc.) ?---Yes ?List chronic conditions. ---osteomyalitis ?Is this a behavioral health or substance abuse call? ---No ?Guidelines ?Guideline Title Affirmed Question Affirmed Notes Nurse Date/Time (Eastern ?Time) ?Diarrhea [1] SEVERE diarrhea ?(e.g., 7 or more ?times / day more than ?normal) AND [2] age ?> 61 years ?Doyle Askew, RN, [place] 08/27/2021 2:33:38 ?PM ?Disp. Time (Eastern ?Time) Disposition Final User ?08/27/2021 2:41:41 PM  See HCP within 4 Hours (or ?PCP triage) ?Yes Doyle Askew, RN, [place] ?Caller Disagree/Comply Comply ?Caller Understands Yes ?PreDisposition Home Care ?Care Advice Given Per Guideline ?* IF OFFICE WILL BE OPEN: You need to be seen within the next 3 or 4 hours. Call your doctor (or NP/PA) now or as soon as ?the office opens. SEE HCP (OR PCP TRIAGE) WITHIN 4 HOURS: CLEAR FLUIDS: * Drink more fluids. * Sip water or a halfstrength sports drink (e.g., Gatorade, Powerade; mix half and half with water). * Other options: An oral rehydration solution (e.g., ?Pedialyte, Rehydralyte). * You become worse CALL BACK IF: CARE ADVICE given per Diarrhea (Adult) guideline. ?Comments ?User: Melene Muller, RN Date/Time Eilene Ghazi Time): 08/27/2021 2:42:00 PM ?Office has no appts appts available ?Referrals ?REFERRED TO PCP OFFICE ?[facility] - ED ?

## 2021-08-31 ENCOUNTER — Ambulatory Visit: Payer: Medicare Other | Admitting: Family Medicine

## 2021-08-31 ENCOUNTER — Ambulatory Visit (INDEPENDENT_AMBULATORY_CARE_PROVIDER_SITE_OTHER): Payer: Medicare Other | Admitting: Orthopedic Surgery

## 2021-08-31 ENCOUNTER — Ambulatory Visit (INDEPENDENT_AMBULATORY_CARE_PROVIDER_SITE_OTHER): Payer: Medicare Other | Admitting: Family Medicine

## 2021-08-31 ENCOUNTER — Encounter: Payer: Self-pay | Admitting: Family Medicine

## 2021-08-31 VITALS — BP 150/84 | HR 67 | Temp 98.7°F | Ht 67.0 in

## 2021-08-31 DIAGNOSIS — M5410 Radiculopathy, site unspecified: Secondary | ICD-10-CM

## 2021-08-31 DIAGNOSIS — M5412 Radiculopathy, cervical region: Secondary | ICD-10-CM

## 2021-08-31 DIAGNOSIS — R197 Diarrhea, unspecified: Secondary | ICD-10-CM

## 2021-08-31 MED ORDER — METRONIDAZOLE 500 MG PO TABS: 500.0000 mg | ORAL_TABLET | Freq: Three times a day (TID) | ORAL | 0 refills | Status: DC

## 2021-08-31 MED ORDER — BUTALBITAL-APAP-CAFFEINE 50-325-40 MG PO TABS: 1.0000 | ORAL_TABLET | Freq: Four times a day (QID) | ORAL | 1 refills | Status: DC | PRN

## 2021-08-31 NOTE — Patient Instructions (Signed)
It was very nice to see you today! ? ?Bananas, rice, apples, toast. ? ? ?PLEASE NOTE: ? ?If you had any lab tests please let us know if you have not heard back within a few days. You may see your results on MyChart before we have a chance to review them but we will give you a call once they are reviewed by [place]. If we ordered any referrals today, please let us know if you have not heard from their office within the next week.  ? ?Please try these tips to maintain a healthy lifestyle: ? ?Eat most of your calories during the day when you are active. Eliminate processed foods including packaged sweets (pies, cakes, cookies), reduce intake of potatoes, white bread, white pasta, and white rice. Look for whole grain options, oat flour or almond flour. ? ?Each meal should contain half fruits/vegetables, one quarter protein, and one quarter carbs (no bigger than a computer mouse). ? ?Cut down on sweet beverages. This includes juice, soda, and sweet tea. Also watch fruit intake, though this is a healthier sweet option, it still contains natural sugar! Limit to 3 servings daily. ? ?Drink at least 1 glass of water with each meal and aim for at least 8 glasses per day ? ?Exercise at least 150 minutes every week.   ?

## 2021-08-31 NOTE — Progress Notes (Addendum)
? ?Subjective:  ? ? ? Patient ID: Renee Pitts, female    DOB: 01-04-1961, 61 y.o.   MRN: [id number] ? ?Chief Complaint  ?Patient presents with  ? Headache  ?  Follow-up on headache, getting better, has been taking medication twice a day, will need more than 14 pills a month ?  ? ? ?HPI-here w/friend. ? HA-solid ha and not as extreme but constant.  Taking fioricet bid.   ?Nausea-some better.  ?Dizziness-some better.  L eye stil bothering her.  Saw ENT-told needs to see neuro.can't concentrate/talk w/o closing eyes(focuses better).  ?Diarrhea for last 1 wk-watery and clear.  ? Some blood.  Gel like.  Everything goes "straight thru".  No normal BM for over 1 wk. Gets cramping/pain.  Eating makes her sick. So hard to take meds-taking lyrica,  dilaudid tid.  Only the cymbalta taking once/d if at all.  Didn't take augmentin.  ?R leg stump-pain-seeing ortho today, can't take baclofen-talking crazy, etc.  Requesting handicapped placard.  ? ?Health Maintenance Due  ?Topic Date Due  ? HIV Screening  Never done  ? Hepatitis C Screening  Never done  ? TETANUS/TDAP  Never done  ? Zoster Vaccines- Shingrix (1 of 2) Never done  ? PAP SMEAR-Modifier  Never done  ? COLONOSCOPY (Pts 45-66yr Insurance coverage will need to be confirmed)  Never done  ? MAMMOGRAM  Never done  ? ? ?Past Medical History:  ?Diagnosis Date  ? Allergy   ? Anxiety   ? Arthritis   ? Blood transfusion without reported diagnosis   ? Cancer ([facility]   ? Skin  ? COPD [place] obstructive pulmonary disease) [place]   ? Depression   ? GERD (gastroesophageal reflux disease)   ? Sleep apnea   ? Tuberculosis   ? Skin test posituve only  ? ? ?Past Surgical History:  ?Procedure Laterality Date  ? ABDOMINAL HYSTERECTOMY    ? endometriosis  ? APPENDECTOMY    ? BREAST SURGERY    ? CHOLECYSTECTOMY    ? COLON SURGERY    ? "twisted colon"  ? FRACTURE SURGERY    ? JOINT REPLACEMENT Right   ? mult R knee  ? LEG AMPUTATION THROUGH FEMUR Right   ? ? ?Outpatient Medications Prior to  Visit  ?Medication Sig Dispense Refill  ? DULoxetine (CYMBALTA) 60 MG capsule Take 1 capsule (60 mg total) by mouth 2 (two) times daily. 180 capsule 0  ? erythromycin ophthalmic ointment Place 1 application. into the left eye at bedtime.    ? HYDROmorphone (DILAUDID) 2 MG tablet Take 1 tablet (2 mg total) by mouth 4 (four) times daily as needed (for pain). 90 tablet 0  ? hydrOXYzine (ATARAX) 25 MG tablet Take 25 mg by mouth 3 (three) times daily as needed for itching.    ? ondansetron (ZOFRAN) 4 MG tablet Take 1 tablet (4 mg total) by mouth every 8 (eight) hours as needed for nausea or vomiting. 30 tablet 1  ? pramipexole (MIRAPEX) 1 MG tablet Take 1 mg by mouth 2 (two) times daily.    ? prednisoLONE acetate (PRED FORTE) 1 % ophthalmic suspension Place 1 drop into the left eye 3 (three) times daily.    ? pregabalin (LYRICA) 150 MG capsule Take 150 mg by mouth 3 (three) times daily.    ? butalbital-acetaminophen-caffeine (FIORICET) 50-325-40 MG tablet Take 1 tablet by mouth every 6 (six) hours as needed for headache or migraine. 14 tablet 0  ? amoxicillin-clavulanate (AUGMENTIN) 875-125 MG  tablet Take 1 tablet by mouth 2 (two) times daily. 20 tablet 0  ? baclofen (LIORESAL) 10 MG tablet Take 1 tablet (10 mg total) by mouth 3 (three) times daily. 90 each 0  ? traZODone (DESYREL) 50 MG tablet Take 50 mg by mouth at bedtime as needed for sleep.    ? ?No facility-administered medications prior to visit.  ? ? ?Allergies  ?Allergen Reactions  ? Vancomycin Anaphylaxis and Shortness Of Breath  ? Metoclopramide Other (See Comments)  ?  Reaction not recalled  ? Prochlorperazine Other (See Comments)  ?  Agitation, Mental Status Changes, and increased RLS symptoms  ? Baclofen Hypertension  ? Benadryl [Diphenhydramine] Other (See Comments)  ?  "Hypes me up and causes my legs to go into motion very badly"  ? Doxycycline   ? Morphine Hives and Other (See Comments)  ?  Ineffective, also  ? ?ROS neg/noncontributory except as noted  HPI/below ?Can't recall what meds and when took-wonders about dispensing machine ? ?Needs handicapped parking ? ? ?   ?Objective:  ?  ? ?BP (!) 150/84   Pulse 67   Temp 98.7 ?F (37.1 ?C) (Temporal)   Ht '5\' 7"'$  (1.702 m)   SpO2 96%   BMI 30.85 kg/m?  ?Wt Readings from Last 3 Encounters:  ?08/19/21 197 lb (89.4 kg)  ?08/18/21 197 lb 2 oz (89.4 kg)  ?07/27/21 190 lb (86.2 kg)  ? ? ?Physical Exam  ? ?Gen: WDWN NAD ?HEENT: NCAT, conjunctiva not injected, sclera nonicteric ?NECK:  supple, no thyromegaly, no nodes, no carotid bruits ?CARDIAC: RRR, S1S2+, no murmur.  ?LUNGS: CTAB. No wheezes ?ABDOMEN:  BS+, soft, sl tender lower abd. No HSM, no masses ?EXT:  no edema ?MSK: R aka   pt in w/c.  ?NEURO: A&O x3.  CN II-XII intact.  ?PSYCH: normal mood. Good eye contact ? ?   ?Assessment & Plan:  ? ?Problem List Items Addressed This Visit   ?None ?Visit Diagnoses   ? ? Diarrhea, unspecified type    -  Primary  ? Relevant Orders  ? C. difficile GDH and Toxin A/B  ? ?  ? Diarrhea-? C diff-check stool for toxin.  Flagyl.  Monitor ?  HA-etiol unclear.  Some better.  W/u neg.  Advised fioricet BID not way to manage this.  Also on tid dilaudid-referral in progress to neruo. ?Dizziness-saw ENT-not related to anything per them.  Per pt, they advised neuro-referral still pending ?R AKA-chronic pain.  Needs resources-seeing ortho today.  Take the cymbalta daily and work on bid.   ? ?Meds ordered this encounter  ?Medications  ? butalbital-acetaminophen-caffeine (FIORICET) 50-325-40 MG tablet  ?  Sig: Take 1 tablet by mouth every 6 (six) hours as needed for headache or migraine.  ?  Dispense:  60 tablet  ?  Refill:  1  ? metroNIDAZOLE (FLAGYL) 500 MG tablet  ?  Sig: Take 1 tablet (500 mg total) by mouth 3 (three) times daily.  ?  Dispense:  30 tablet  ?  Refill:  0  ? ? ?Wellington Hampshire, MD ? ?

## 2021-09-01 ENCOUNTER — Encounter: Payer: Self-pay | Admitting: Family Medicine

## 2021-09-01 ENCOUNTER — Other Ambulatory Visit: Payer: Medicare Other

## 2021-09-02 ENCOUNTER — Encounter: Payer: Self-pay | Admitting: Family Medicine

## 2021-09-02 ENCOUNTER — Telehealth: Payer: Self-pay | Admitting: Orthopedic Surgery

## 2021-09-02 ENCOUNTER — Other Ambulatory Visit: Payer: Self-pay | Admitting: *Deleted

## 2021-09-02 DIAGNOSIS — R197 Diarrhea, unspecified: Secondary | ICD-10-CM

## 2021-09-02 LAB — C. DIFFICILE GDH AND TOXIN A/B
GDH ANTIGEN: NOT DETECTED
MICRO NUMBER:: 13279686
SPECIMEN QUALITY:: ADEQUATE
TOXIN A AND B: NOT DETECTED

## 2021-09-02 NOTE — Telephone Encounter (Signed)
Received call from [facility], requesting 4/17 , fax to [phone] ph [phone] ?

## 2021-09-02 NOTE — Telephone Encounter (Signed)
Sent message to referral coordinator.  

## 2021-09-03 ENCOUNTER — Ambulatory Visit
Admission: RE | Admit: 2021-09-03 | Discharge: 2021-09-03 | Disposition: A | Discharge status: Home / Self Care | Payer: Medicare Other | Source: Ambulatory Visit | Attending: Orthopedic Surgery | Admitting: Orthopedic Surgery

## 2021-09-03 DIAGNOSIS — M5410 Radiculopathy, site unspecified: Secondary | ICD-10-CM

## 2021-09-03 IMAGING — MR MR CERVICAL SPINE W/O CM
4 of 5 series · 28 of 48 positions shown · non-contrast
Comparison: None.

CLINICAL DATA: Myelopathy, chronic, cervical spine. Right arm pain
radiating to elbow with swelling, and notes bilateral hand numbness,
left greater than right, and reports numbness from waist down.

EXAM:
MRI CERVICAL SPINE WITHOUT CONTRAST
TECHNIQUE: Multiplanar, multisequence MR imaging of the cervical spine was
performed. No intravenous contrast was administered.

[Series 2: T2 · sagittal · 3.0mm · 0.82mm/px · 6 of 19 slices shown (1 of 2)]
[im 1/19]
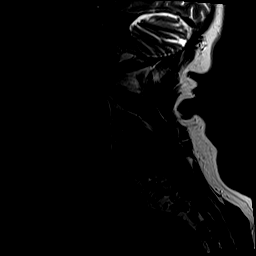
[im 4/19]
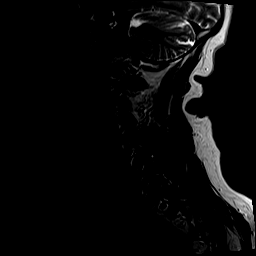
[im 8/19]
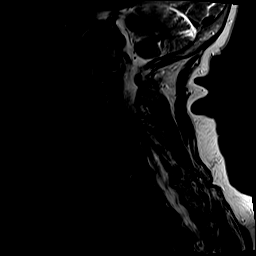
[im 11/19]
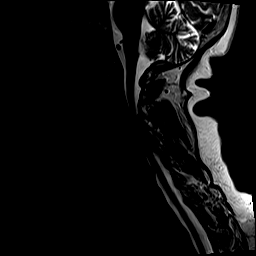
[im 15/19]
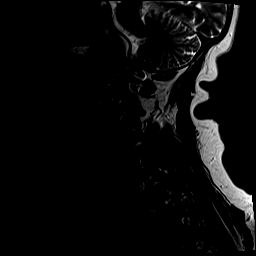
[im 19/19]
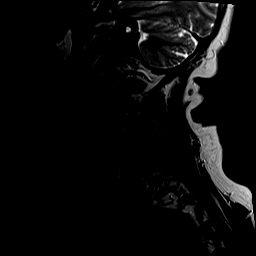

[Series 3: T1 · sagittal · 3.0mm · 0.41mm/px · 7 of 19 slices shown]
[im 1/19]
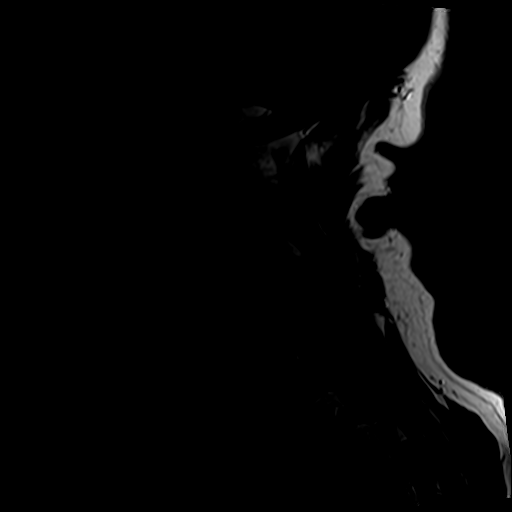
[im 4/19]
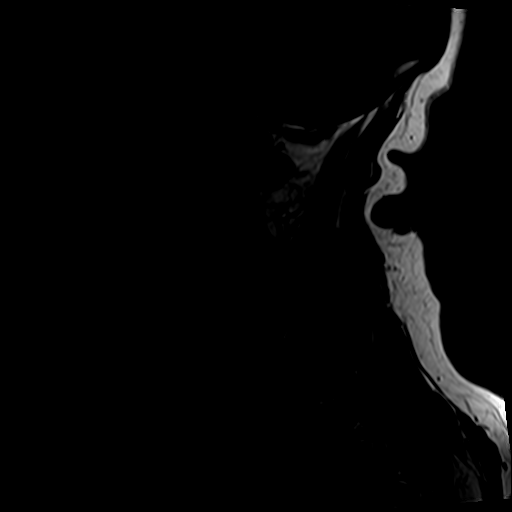
[im 7/19]
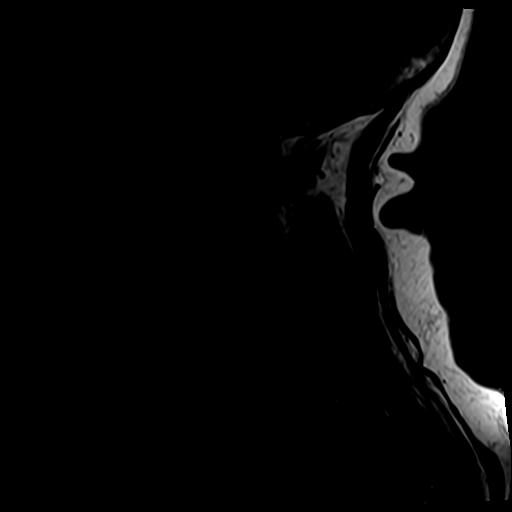
[im 10/19]
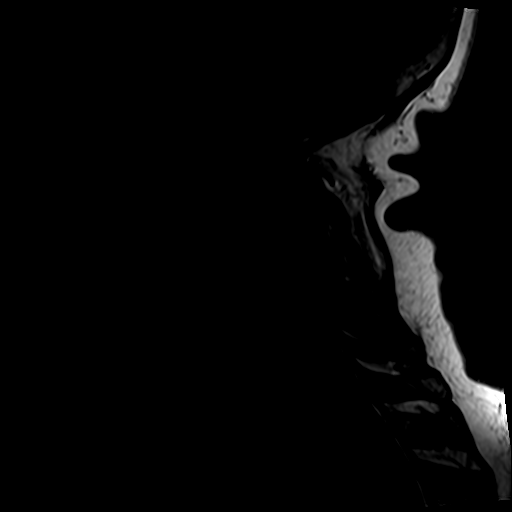
[im 13/19]
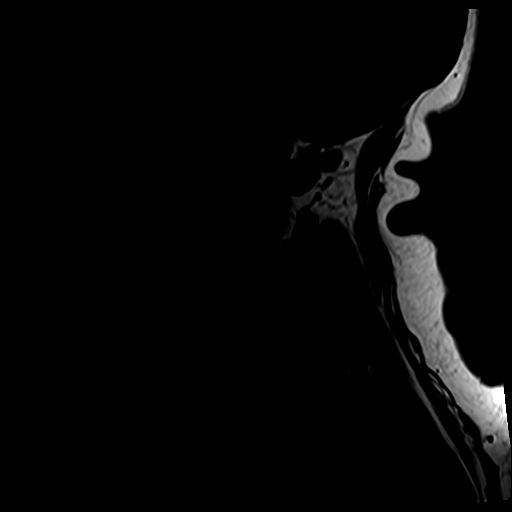
[im 16/19]
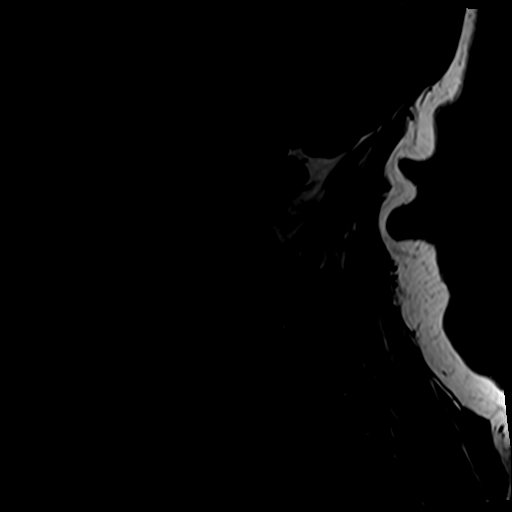
[im 19/19]
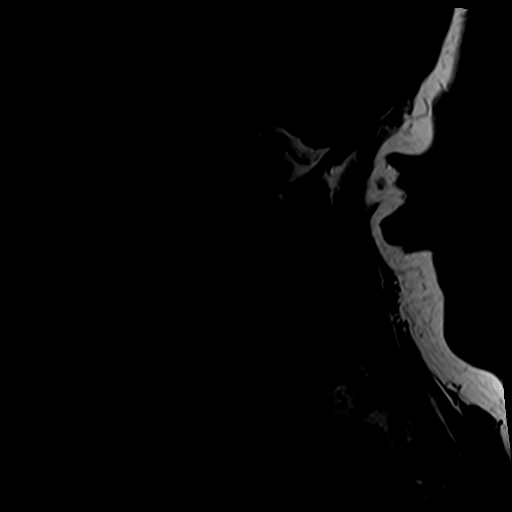

[Series 4: tir sag · sagittal · 3.0mm · 0.41mm/px · 7 of 19 slices shown]
[im 1/19]
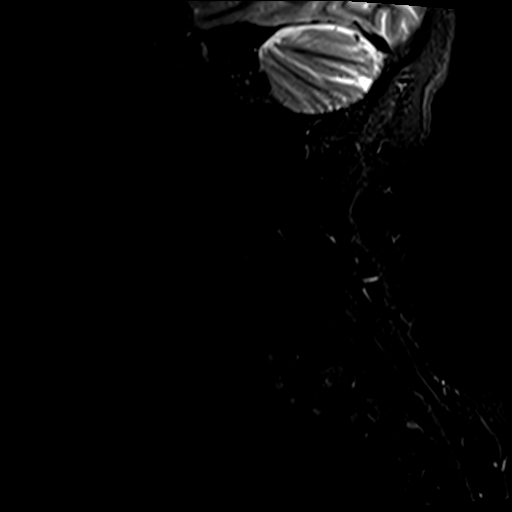
[im 4/19]
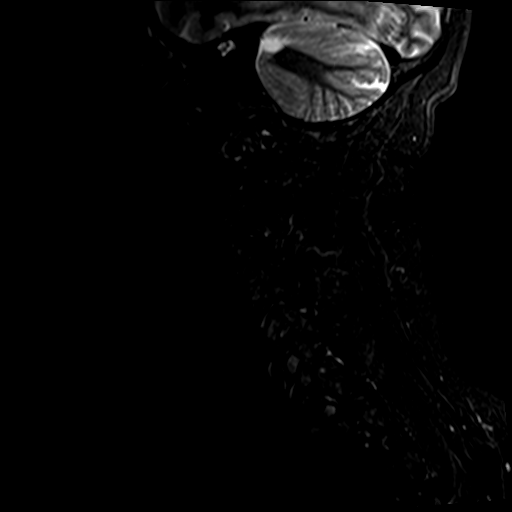
[im 7/19]
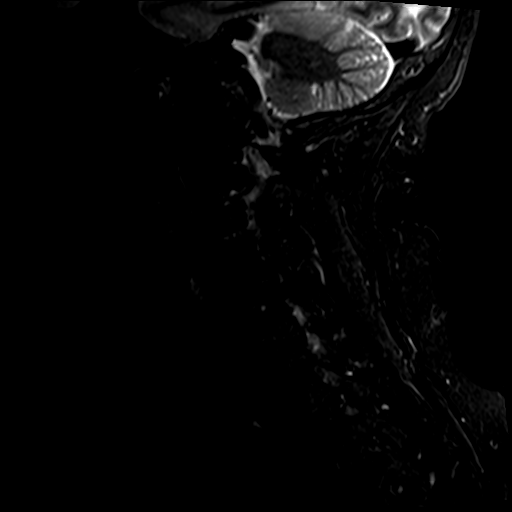
[im 10/19]
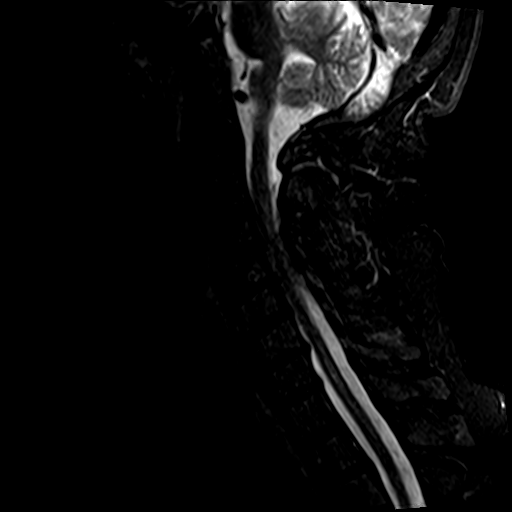
[im 13/19]
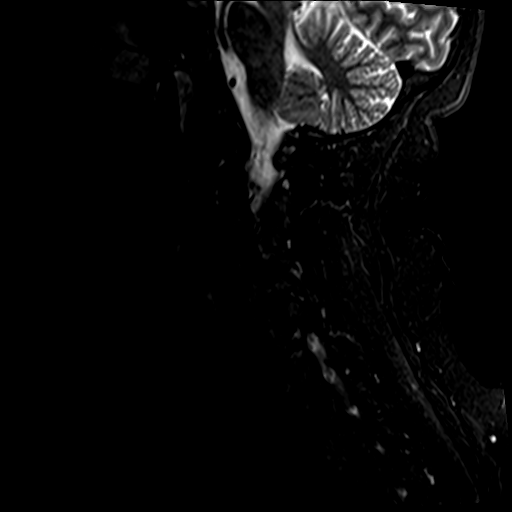
[im 16/19]
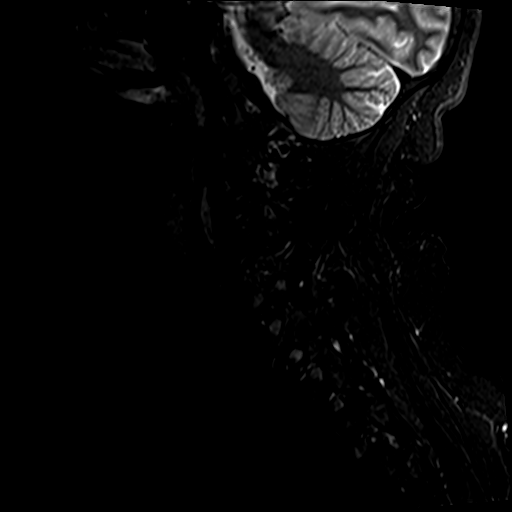
[im 19/19]
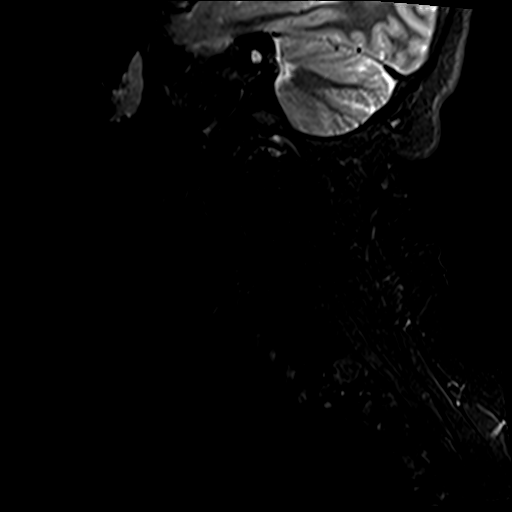

[Series 6: T2 · axial · 3.0mm · 0.78mm/px · z∈[-94,+39]mm · 8 of 38 slices shown (2 of 2)]
[im 1/38]
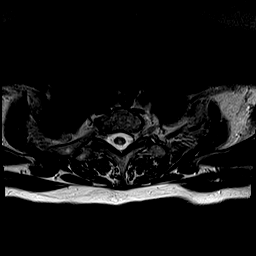
[im 6/38]
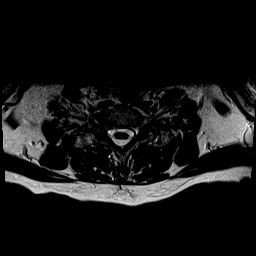
[im 12/38]
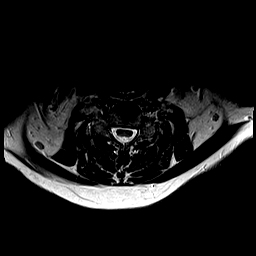
[im 18/38]
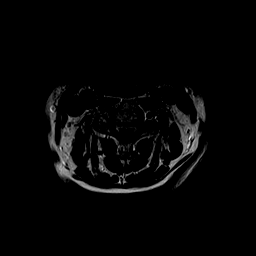
[im 20/38]
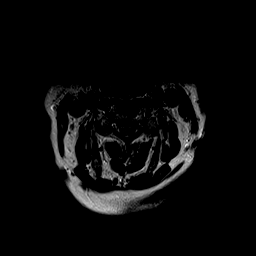
[im 26/38]
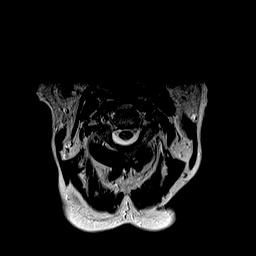
[im 32/38]
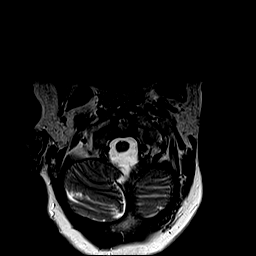
[im 38/38]
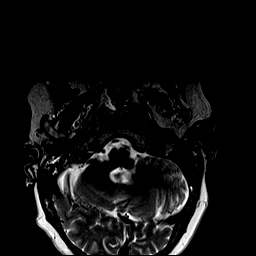

[28 of 48 positions shown; findings below may reference images not displayed]

FINDINGS: Alignment: Straightening of the normal cervical lordosis. No
substantial sagittal subluxation.

Vertebrae: Vertebral body heights are maintained. No focal marrow
edema chest the fracture or discitis/osteomyelitis. No suspicious
bone lesions.

Cord: Normal cord signal.

Posterior Fossa, vertebral arteries, paraspinal tissues: Visualized
vertebral artery flow voids are maintained. No acute abnormality in
the visualized posterior fossa. No paraspinal edema.

Disc levels:

C2-C3: Small posterior disc osteophyte complex and mild left greater
than right facet and uncovertebral hypertrophy. Mild left foraminal
stenosis. No significant canal or right foraminal stenosis.

C3-C4: Posterior disc osteophyte complex with ligamentum flavum
thickening and bilateral facet and uncovertebral hypertrophy.
Resulting moderate to severe canal stenosis and right greater than
left foraminal stenosis.

C4-C5: Posterior disc osteophyte complex with bilateral facet
uncovertebral hypertrophy. Resulting moderate canal stenosis with
severe bilateral foraminal stenosis.

C5-C6: Posterior disc osteophyte complex with right greater than
left facet and uncovertebral hypertrophy. Resulting moderate to
severe bilateral foraminal stenosis with mild canal stenosis.

C6-C7: Posterior disc osteophyte complex with bilateral facet
uncovertebral hypertrophy. Resulting moderate bilateral foraminal
stenosis without significant canal stenosis.

C7-T1: Right greater than left facet and uncovertebral hypertrophy
resulting moderate right foraminal stenosis. Mild left foraminal
stenosis. No significant canal stenosis.
IMPRESSION: 1. At C4-C5, severe bilateral foraminal stenosis with moderate canal
stenosis.
2. At C3-C4, moderate to severe canal stenosis and right greater
than left foraminal stenosis.
3. At C5-C6, moderate to severe bilateral foraminal stenosis with
mild canal stenosis.
4. At C6-C7, moderate bilateral foraminal stenosis.
5. At C7-T1, moderate right foraminal stenosis.

## 2021-09-08 ENCOUNTER — Other Ambulatory Visit: Payer: Self-pay

## 2021-09-08 ENCOUNTER — Telehealth: Payer: Self-pay

## 2021-09-08 DIAGNOSIS — M5410 Radiculopathy, site unspecified: Secondary | ICD-10-CM

## 2021-09-08 NOTE — Telephone Encounter (Signed)
Called and sw pt to advise of message below. Pt is interested in appt with FN and I have cx the appt with Dr. Sharol Given on 09/14/21 and placed referral for Dr. Ernestina Patches. Pt will call with questions.  ?

## 2021-09-08 NOTE — Telephone Encounter (Signed)
-----   Message from Newt Minion, MD sent at 09/05/2021  8:46 AM EDT ----- ?Lets have patient follow-up with Renee Pitts to see if there is intervention [place] could provide to help. ?----- Message ----- ?From: Interface, Rad Results In ?Sent: 09/03/2021   1:55 PM EDT ?To: Newt Minion, MD ? ? ?

## 2021-09-09 ENCOUNTER — Other Ambulatory Visit: Payer: Self-pay | Admitting: *Deleted

## 2021-09-09 ENCOUNTER — Encounter: Payer: Self-pay | Admitting: Family Medicine

## 2021-09-09 MED ORDER — PRAMIPEXOLE DIHYDROCHLORIDE 1 MG PO TABS: 1.0000 mg | ORAL_TABLET | Freq: Two times a day (BID) | ORAL | 0 refills | Status: DC

## 2021-09-10 ENCOUNTER — Encounter: Payer: Self-pay | Admitting: Family

## 2021-09-10 ENCOUNTER — Ambulatory Visit (INDEPENDENT_AMBULATORY_CARE_PROVIDER_SITE_OTHER): Payer: Medicare Other | Admitting: Family

## 2021-09-10 VITALS — BP 117/79 | HR 88 | Temp 98.3°F | Ht 67.0 in | Wt 197.0 lb

## 2021-09-10 DIAGNOSIS — R21 Rash and other nonspecific skin eruption: Secondary | ICD-10-CM

## 2021-09-10 DIAGNOSIS — G894 Chronic pain syndrome: Secondary | ICD-10-CM

## 2021-09-10 MED ORDER — DOXYCYCLINE HYCLATE 100 MG PO TABS: 100.0000 mg | ORAL_TABLET | Freq: Two times a day (BID) | ORAL | 0 refills | Status: DC

## 2021-09-10 MED ORDER — PRAMIPEXOLE DIHYDROCHLORIDE 1 MG PO TABS: 1.0000 mg | ORAL_TABLET | Freq: Two times a day (BID) | ORAL | 0 refills | Status: DC

## 2021-09-10 MED ORDER — TIZANIDINE HCL 4 MG PO TABS: 4.0000 mg | ORAL_TABLET | Freq: Four times a day (QID) | ORAL | 0 refills | Status: AC | PRN

## 2021-09-10 MED ORDER — MUPIROCIN 2 % EX OINT: 1.0000 "application " | TOPICAL_OINTMENT | Freq: Two times a day (BID) | CUTANEOUS | 0 refills | Status: AC

## 2021-09-10 NOTE — Patient Instructions (Addendum)
It was very nice to see you today! ? ?I sent over Tizanidine (Xanaflex) muscle relaxer to your pharmacy for your muscle spasms.  ?I have sent an antibiotic for your arm rash to your pharmacy. Start this today. I also sent an antibiotic ointment to apply to your right elbow and any open areas of the rash (not on the scabs).  ?Use this AFTER you have washed your arm with antibacterial or Hypoallergenic soap and dried well. ?Cover your elbow with a large bandage that has adhesive all around the edges to stick well. Keep bandage on until sore is healed. ? ?Continue taking the Hydroxyzine 3 times per day as needed for the itching. ? ?Call us back if rash is not improving after 3-4 days. ? ? ? ? ?PLEASE NOTE: ? ?If you had any lab tests please let us know if you have not heard back within a few days. You may see your results on MyChart before we have a chance to review them but we will give you a call once they are reviewed by [place]. If we ordered any referrals today, please let us know if you have not heard from their office within the next week.  ? ?

## 2021-09-10 NOTE — Progress Notes (Signed)
? ?Subjective:  ? ? ? Patient ID: Renee Pitts, female    DOB: [birth year], 61 y.o.   MRN: [id number] ? ?Chief Complaint  ?Patient presents with  ? Rash  ?  Pt c/o bumps/rash on right arm for about a week. Has tried peroxide and water when she scratches it. Itching and redness.   ? Back Pain  ?  Discuss Muscle relaxer  ? ?HPI: ?Skin rash:  reports having rash on elbow for a month after having to go to ER, then rash on inside of right arm started about a week ago, appears as impetigo or shingles but no paresthesias with it, except pain in the elbow where there is some mild swelling. She has scabs on rash in 3 different areas on inside forearm and lateral forearm, and is itchy, she does report scratching and causing bleeding.  ? ?Pain ?She reports chronic back pain. There was not an injury that may have caused the pain. The pain started  years ago  and is gradually worsening. The pain does radiate to legs sometimes. The pain is described as aching, soreness, stabbing, and spasm-like, is 9/10 in intensity, occurring intermittently. Symptoms are worse in the: all the time. She has tried prescription pain relievers with mild relief. States she was unable to take Baclofen prescribed last, caused her to feel very weird and shaky. ? ?Assessment & Plan:  ? ?Problem List Items Addressed This Visit   ? ?  ? Other  ? Chronic pain syndrome  ? Relevant Medications  ? tiZANidine (ZANAFLEX) 4 MG tablet  ? ?Other Visit Diagnoses   ? ? Skin rash    -  Primary  ? Reviewed chart notes, this chronic itching is a problem that causes scratching and sores, but pt states this is a new rash with sores that have itched. 1 on lateral arm is 4 cm in length, 2cm width and scabbed, long scabbed wound with pinpoint surrounding scabs on medial arm approx. 6cmLx3cmW, and 2 smaller areas on inside arm just distal to elbow that are scabbed. All the above have small amount of erythema periwound. One lesion on posterior elbow, pt reports has been there  for a month, keeps getting reopened and bleeds, due to leaning it on wheelchair. Sending antibiotic due to tenderness and erythema, Bactroban ointment to apply to elbow only after cleaning with soap and water, drying well, apply large adhesive bandage to protect until healed. ? ?Relevant Medications  ? mupirocin ointment (BACTROBAN) 2 %  ? doxycycline (VIBRA-TABS) 100 MG tablet  ? ?  ? ?Outpatient Medications Prior to Visit  ?Medication Sig Dispense Refill  ? butalbital-acetaminophen-caffeine (FIORICET) 50-325-40 MG tablet Take 1 tablet by mouth every 6 (six) hours as needed for headache or migraine. 60 tablet 1  ? DULoxetine (CYMBALTA) 60 MG capsule Take 1 capsule (60 mg total) by mouth 2 (two) times daily. 180 capsule 0  ? erythromycin ophthalmic ointment Place 1 application. into the left eye at bedtime.    ? HYDROmorphone (DILAUDID) 2 MG tablet Take 1 tablet (2 mg total) by mouth 4 (four) times daily as needed (for pain). 90 tablet 0  ? hydrOXYzine (ATARAX) 25 MG tablet Take 25 mg by mouth 3 (three) times daily as needed for itching.    ? metroNIDAZOLE (FLAGYL) 500 MG tablet Take 1 tablet (500 mg total) by mouth 3 (three) times daily. 30 tablet 0  ? ondansetron (ZOFRAN) 4 MG tablet Take 1 tablet (4 mg total) by mouth every 8 (  eight) hours as needed for nausea or vomiting. 30 tablet 1  ? prednisoLONE acetate (PRED FORTE) 1 % ophthalmic suspension Place 1 drop into the left eye 3 (three) times daily.    ? pregabalin (LYRICA) 150 MG capsule Take 150 mg by mouth 3 (three) times daily.    ? pramipexole (MIRAPEX) 1 MG tablet Take 1 tablet (1 mg total) by mouth 2 (two) times daily. 60 tablet 0  ? ?No facility-administered medications prior to visit.  ? ? ?Past Medical History:  ?Diagnosis Date  ? Allergy   ? Anxiety   ? Arthritis   ? Blood transfusion without reported diagnosis   ? Cancer [facility])   ? Skin  ? COPD (chronic obstructive pulmonary disease) ([place])   ? Depression   ? GERD (gastroesophageal reflux disease)   ?  Sleep apnea   ? Tuberculosis   ? Skin test posituve only  ? ? ?Past Surgical History:  ?Procedure Laterality Date  ? ABDOMINAL HYSTERECTOMY    ? endometriosis  ? APPENDECTOMY    ? BREAST SURGERY    ? CHOLECYSTECTOMY    ? COLON SURGERY    ? "twisted colon"  ? FRACTURE SURGERY    ? JOINT REPLACEMENT Right   ? mult R knee  ? LEG AMPUTATION THROUGH FEMUR Right   ? ? ?Allergies  ?Allergen Reactions  ? Vancomycin Anaphylaxis and Shortness Of Breath  ? Metoclopramide Other (See Comments)  ?  Reaction not recalled  ? Prochlorperazine Other (See Comments)  ?  Agitation, Mental Status Changes, and increased RLS symptoms  ? Baclofen Hypertension  ? Benadryl [Diphenhydramine] Other (See Comments)  ?  "Hypes me up and causes my legs to go into motion very badly"  ? Morphine Hives and Other (See Comments)  ?  Ineffective, also  ? ? ?   ?Objective:  ?  ?Physical Exam ?Vitals and nursing note reviewed.  ?Constitutional:   ?   Appearance: Normal appearance.  ?Cardiovascular:  ?   Rate and Rhythm: Normal rate and regular rhythm.  ?Pulmonary:  ?   Effort: Pulmonary effort is normal.  ?   Breath sounds: Normal breath sounds.  ?Musculoskeletal:     ?   General: Normal range of motion.  ?Skin: ?   General: Skin is warm and dry.  ?   Findings: Erythema (see diagram), lesion (see diagram) and rash (see diagram) present.  ? ?    ?Neurological:  ?   Mental Status: She is alert.  ?Psychiatric:     ?   Mood and Affect: Mood normal.     ?   Behavior: Behavior normal.  ? ? ?BP 117/79 (BP Location: Left Arm, Patient Position: Sitting, Cuff Size: Large)   Pulse 88   Temp 98.3 ?F (36.8 ?C) (Temporal)   Ht '5\' 7"'$  (1.702 m)   Wt 197 lb (89.4 kg)   SpO2 97%   BMI 30.85 kg/m?  ?Wt Readings from Last 3 Encounters:  ?09/10/21 197 lb (89.4 kg)  ?08/19/21 197 lb (89.4 kg)  ?08/18/21 197 lb 2 oz (89.4 kg)  ? ?   ? ?Jeanie Sewer, NP ? ?

## 2021-09-13 ENCOUNTER — Encounter: Payer: Self-pay | Admitting: Orthopedic Surgery

## 2021-09-13 NOTE — Progress Notes (Signed)
? ?Office Visit Note ?  ?Patient: Renee Pitts           ?Date of Birth: 27-Dec-1960           ?MRN: [id number] ?Visit Date: 08/31/2021 ?             ?Requested by: Tawnya Crook, MD ?[address] ?[place],  [place] [id number] ?PCP: Tawnya Crook, MD ? ?Chief Complaint  ?Patient presents with  ? Right Leg - Wound Check  ?  Went to ED for right leg on 08/13/21 ?Hx right AKA  ? ? ? ? ?HPI: ?Patient is a 61 year old woman who was seen for evaluation for cervical and lower back radicular pain.  Patient is status post a right above-the-knee amputation.  Patient has had problems with the residual limb since her surgery in May 2019 she reports 2 episodes of bone infection.  Patient states that she has no back symptoms and has had an MRI scan of her back.  Patient states she has had no feeling in her left leg for about 9 months.  Patient states that when this happened her hands went numb as well.  Patient states she now has no feeling from the waist down.  Patient is being tested for C. difficile.  She states that all 4 extremities are numb. ? ?MRI scan of the right femur on August 14, 2021 shows fluid but no bony changes in the distal femur. ? ?Assessment & Plan: ?Visit Diagnoses:  ?1. Bilateral radicular pain   ? ? ?Plan: We will obtain an MRI scan of her cervical spine.  Discussed her options for the right lower extremity would be observation versus a hip disarticulation.  We will have consultation with Dr. Ernestina Patches to see if epidural steroid injections could be helpful. ? ?Follow-Up Instructions: Return in about 2 weeks (around 09/14/2021).  ? ?Ortho Exam ? ?Patient is alert, oriented, no adenopathy, well-dressed, normal affect, normal respiratory effort. ?Examination of the right above-the-knee amputation there is no redness no drainage no clinical signs of an abscess.  Patient reports decreased sensation in the left leg and both hands.  The CT scans does show a trochanteric nail in place on the right with large  area of lytic bone changes at the fusion site.  Patient states that she had been offered a hip disarticulation on the right 2 years ago.  She cannot wear her prosthesis on the right. ? 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 ? ?Imaging: ?No results found. ?No images are attached to the encounter. ? ?Labs: ?Lab Results  ?Component Value Date  ? ESRSEDRATE 15 07/27/2021  ? CRP 0.8 07/27/2021  ? REPTSTATUS 08/15/2021 FINAL 08/14/2021  ? CULT MULTIPLE SPECIES PRESENT, SUGGEST RECOLLECTION (A) 08/14/2021  ? ? ? ?Lab Results  ?Component Value Date  ? ALBUMIN 3.4 (L) 08/13/2021  ? ALBUMIN 3.8 07/27/2021  ? ? ?No results found for: MG ?No results found for: VD25OH ? ?No results found for: PREALBUMIN ? ?  Latest Ref Rng & Units 08/13/2021  ?  7:57 PM 07/27/2021  ?  4:28 PM  ?CBC EXTENDED  ?WBC 4.0 - 10.5 K/uL 10.1   10.5    ?RBC 3.87 - 5.11 MIL/uL 4.58   4.71    ?Hemoglobin 12.0 - 15.0 g/dL 14.5   14.9    ?HCT 36.0 - 46.0 % 44.0   45.5    ?Platelets 150 - 400 K/uL  198   231    ?NEUT# 1.7 - 7.7 K/uL 6.8   7.0    ?Lymph# 0.7 - 4.0 K/uL 2.7   2.8    ? ? ? ?There is no height or weight on file to calculate BMI. ? ?Orders:  ?Orders Placed This Encounter  ?Procedures  ? MR Cervical Spine w/o contrast  ? ?No orders of the defined types were placed in this encounter. ? ? ? Procedures: ?No procedures performed ? ?Clinical Data: ?No additional findings. ? ?ROS: ? ?All other systems negative, except as noted in the HPI. ?Review of Systems ? ?Objective: ?Vital Signs: There were no vitals taken for this visit. ? ?Specialty Comments:  ?No specialty comments available. ? ?PMFS History: ?Patient Active Problem List  ? Diagnosis Date Noted  ? Chronic pain syndrome 08/18/2021  ? Status post above-knee amputation of right lower extremity ([place]) 08/18/2021  ? Corneal ulcer of left eye 08/18/2021  ? ?Past Medical History:  ?Diagnosis Date  ? Allergy   ? Anxiety   ?  Arthritis   ? Blood transfusion without reported diagnosis   ? Cancer [facility])   ? Skin  ? COPD (chronic obstructive pulmonary disease) ([place])   ? Depression   ? GERD (gastroesophageal reflux disease)   ? Sleep apnea   ? Tuberculosis   ? Skin test posituve only  ?  ?Family History  ?Problem Relation Age of Onset  ? Early death Mother   ? COPD Sister   ? Early death Sister   ? Heart disease Sister   ? Early death Brother   ? Heart disease Brother   ?  ?Past Surgical History:  ?Procedure Laterality Date  ? ABDOMINAL HYSTERECTOMY    ? endometriosis  ? APPENDECTOMY    ? BREAST SURGERY    ? CHOLECYSTECTOMY    ? COLON SURGERY    ? "twisted colon"  ? FRACTURE SURGERY    ? JOINT REPLACEMENT Right   ? mult R knee  ? LEG AMPUTATION THROUGH FEMUR Right   ? ?Social History  ? ?Occupational History  ? Not on file  ?Tobacco Use  ? Smoking status: Every Day  ?  Packs/day: 1.00  ?  Years: 15.00  ?  Pack years: 15.00  ?  Types: Cigarettes  ? Smokeless tobacco: Never  ?Vaping Use  ? Vaping Use: Never used  ?Substance and Sexual Activity  ? Alcohol use: Never  ? Drug use: Never  ? Sexual activity: Not Currently  ?  Birth control/protection: None  ? ? ? ? ? ?

## 2021-09-14 ENCOUNTER — Ambulatory Visit: Payer: Medicare Other | Admitting: Orthopedic Surgery

## 2021-09-17 ENCOUNTER — Ambulatory Visit: Payer: Medicare Other | Admitting: Family Medicine

## 2021-09-17 NOTE — Telephone Encounter (Signed)
faxed

## 2021-09-17 NOTE — Telephone Encounter (Signed)
error 

## 2021-09-18 ENCOUNTER — Encounter: Payer: Self-pay | Admitting: Gastroenterology

## 2021-09-18 ENCOUNTER — Ambulatory Visit: Payer: Medicare Other | Admitting: Gastroenterology

## 2021-09-18 VITALS — BP 142/92 | HR 92 | Ht 67.0 in | Wt 180.0 lb

## 2021-09-18 DIAGNOSIS — R194 Change in bowel habit: Secondary | ICD-10-CM | POA: Diagnosis not present

## 2021-09-18 DIAGNOSIS — Z860100 Personal history of colon polyps, unspecified: Secondary | ICD-10-CM | POA: Insufficient documentation

## 2021-09-18 DIAGNOSIS — R1084 Generalized abdominal pain: Secondary | ICD-10-CM | POA: Diagnosis not present

## 2021-09-18 MED ORDER — DICYCLOMINE HCL 10 MG PO CAPS: 10.0000 mg | ORAL_CAPSULE | Freq: Two times a day (BID) | ORAL | 5 refills | Status: AC

## 2021-09-18 MED ORDER — NA SULFATE-K SULFATE-MG SULF 17.5-3.13-1.6 GM/177ML PO SOLN: 1.0000 | Freq: Once | ORAL | 0 refills | Status: AC

## 2021-09-18 NOTE — Patient Instructions (Signed)
If you are age 60 or younger, your body mass index should be between 19-25. Your Body mass index is 28.19 kg/m?Marland Kitchen If this is out of the aformentioned range listed, please consider follow up with your Primary Care Provider.  ?________________________________________________________ ? ?The [facility] providers would like to encourage you to use [facility] to communicate with providers for non-urgent requests or questions.  Due to long hold times on the telephone, sending your provider a message by [facility] may be a faster and more efficient way to get a response.  Please allow 48 business hours for a response.  Please remember that this is for non-urgent requests.  ?_______________________________________________________ ? ?We have sent the following medications to your pharmacy for you to pick up at your convenience: ? ?START: Bentyl '10mg'$  one tablet twice daily ? ?You have been scheduled for a colonoscopy. Please follow written instructions given to you at your visit today.  ?Please pick up your prep supplies at the pharmacy within the next 1-3 days. ?If you use inhalers (even only as needed), please bring them with you on the day of your procedure. ? ?Due to recent changes in healthcare laws, you may see the results of your imaging and laboratory studies on MyChart before your provider has had a chance to review them.  We understand that in some cases there may be results that are confusing or concerning to you. Not all laboratory results come back in the same time frame and the provider may be waiting for multiple results in order to interpret others.  Please give [place] 48 hours in order for your provider to thoroughly review all the results before contacting the office for clarification of your results.  ? ?Thank you for trusting me with your gastrointestinal care!   ? ?Alonza Bogus, PA-C ?

## 2021-09-18 NOTE — Progress Notes (Addendum)
? ? ? ?09/18/2021 ?PRISHA HILEY ?[id number] ?1961/05/05 ? ? ?HISTORY OF PRESENT ILLNESS: This is a 61 year old female who is here today with complaints of "diarrhea" and generalized abdominal pain.  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  She can stand and help transfer herself.  She also reports a history of some type of surgery for a " twisted colon" which she was 61 years old, but she reports that they did not have to do any type of resection.  She describes that she has been having what she describes as clear diarrhea, just clear water like material coming out from her bottom.  She has a remote history of C. difficile, but recent C. difficile was negative.  They treated her with a course of Flagyl and she says that she did get a little bit better while on the Flagyl and since then bowel movements are more solid.  She has had nausea and describes general abdominal bloating.  She has severe periumbilical/mid abdominal pain that gives her severe urge that she has to have a bowel movement and then does not pass anything.  She had a CT scan of the abdomen and pelvis with contrast on 08/13/2021 that did not show any intra-abdominal issues.  She tells me she had a colonoscopy 6 or 7 years ago in [place], [place], that was her last colonoscopy where she had 23 polyps removed.  She had an EGD through [facility] in September 2022.  I can see that she had that performed, but I cannot see the results.  She denies rectal bleeding. ? ?Past Medical History:  ?Diagnosis Date  ? Allergy   ? Anxiety   ? Arthritis   ? Blood transfusion without reported diagnosis   ? Cancer [facility])   ? Skin  ? COPD (chronic obstructive pulmonary disease) ([place])   ? Depression   ? GERD (gastroesophageal reflux disease)   ? Sleep apnea   ? Tuberculosis   ? Skin test posituve only  ? ?Past Surgical History:  ?Procedure Laterality Date  ? ABDOMINAL HYSTERECTOMY    ? endometriosis  ?  APPENDECTOMY    ? BREAST SURGERY    ? CHOLECYSTECTOMY    ? COLON SURGERY    ? "twisted colon"  ? COLONOSCOPY    ? FRACTURE SURGERY    ? JOINT REPLACEMENT Right   ? mult R knee  ? LEG AMPUTATION THROUGH FEMUR Right   ? ? reports that she has been smoking cigarettes. She has a 15.00 pack-year smoking history. She has never used smokeless tobacco. She reports that she does not drink alcohol and does not use drugs. ?family history includes COPD in her sister; Early death in her brother, mother, and sister; Heart disease in her brother and sister; Liver cancer in her maternal grandfather and paternal uncle; Pancreatic cancer in her maternal grandfather and paternal uncle. ?Allergies  ?Allergen Reactions  ? Vancomycin Anaphylaxis and Shortness Of Breath  ? Metoclopramide Other (See Comments)  ?  Reaction not recalled  ? Prochlorperazine Other (See Comments)  ?  Agitation, Mental Status Changes, and increased RLS symptoms  ? Baclofen Hypertension  ? Benadryl [Diphenhydramine] Other (See Comments)  ?  "Hypes me up and causes my legs to go into motion very badly"  ? Morphine Hives and Other (See Comments)  ?  Ineffective, also  ? ? ?  ?Outpatient Encounter Medications as of 09/18/2021  ?Medication Sig  ? butalbital-acetaminophen-caffeine (  FIORICET) 50-325-40 MG tablet Take 1 tablet by mouth every 6 (six) hours as needed for headache or migraine.  ? DULoxetine (CYMBALTA) 60 MG capsule Take 1 capsule (60 mg total) by mouth 2 (two) times daily.  ? erythromycin ophthalmic ointment Place 1 application. into the left eye at bedtime.  ? HYDROmorphone (DILAUDID) 2 MG tablet Take 1 tablet (2 mg total) by mouth 4 (four) times daily as needed (for pain).  ? hydrOXYzine (ATARAX) 25 MG tablet Take 25 mg by mouth 3 (three) times daily as needed for itching.  ? mupirocin ointment (BACTROBAN) 2 % Place 1 application. into the nose 2 (two) times daily.  ? ondansetron (ZOFRAN) 4 MG tablet Take 1 tablet (4 mg total) by mouth every 8 (eight)  hours as needed for nausea or vomiting.  ? pramipexole (MIRAPEX) 1 MG tablet Take 1 tablet (1 mg total) by mouth 2 (two) times daily.  ? prednisoLONE acetate (PRED FORTE) 1 % ophthalmic suspension Place 1 drop into the left eye 3 (three) times daily.  ? pregabalin (LYRICA) 150 MG capsule Take 150 mg by mouth 3 (three) times daily.  ? tiZANidine (ZANAFLEX) 4 MG tablet Take 1 tablet (4 mg total) by mouth every 6 (six) hours as needed for muscle spasms.  ? [DISCONTINUED] doxycycline (VIBRA-TABS) 100 MG tablet Take 1 tablet (100 mg total) by mouth 2 (two) times daily. (Patient not taking: Reported on 09/18/2021)  ? [DISCONTINUED] metroNIDAZOLE (FLAGYL) 500 MG tablet Take 1 tablet (500 mg total) by mouth 3 (three) times daily. (Patient not taking: Reported on 09/18/2021)  ? ?No facility-administered encounter medications on file as of 09/18/2021.  ? ? ? ?REVIEW OF SYSTEMS  : All other systems reviewed and negative except where noted in the History of Present Illness. ? ? ?PHYSICAL EXAM: ?BP (!) 142/92   Pulse 92   Ht '5\' 7"'$  (1.702 m)   Wt 180 lb (81.6 kg)   SpO2 96%   BMI 28.19 kg/m?  ?General: Well developed white female in no acute distress; in wheelchair ?Head: Normocephalic and atraumatic ?Eyes:  Sclerae anicteric, conjunctiva pink. ?Ears: Normal auditory acuity ?Lungs: Clear throughout to auscultation; no W/R/R. ?Heart: Regular rate and rhythm; no M/R/G. ?Abdomen: Soft, non-distended.  BS present.  Mid-abdominal TTP. ?Rectal:  Will be done at the time of colonoscopy. ?Musculoskeletal:  Right leg amputation through the femur. ?Skin: No lesions on visible extremities ?Extremities: No edema  ?Neurological: Alert oriented x 4, grossly non-focal ?Psychological:  Alert and cooperative. Normal mood and affect ? ?ASSESSMENT AND PLAN: ?*Change in bowel habits and generalized abdominal pain: I am not quite sure what is going on with her bowel habits.  She reports passing all kinds of clear fluid like material.  C. difficile is  negative.  We will plan for colonoscopy.  Following that maybe she needs to start more of a constipation bowel regimen with MiraLAX daily.  We will try Bentyl 10 mg twice daily for her abdominal pain and cramping in the interim.  Prescription sent to pharmacy.  Colonoscopy scheduled with Dr. Bryan Lemma.  Patient does have a right leg amputation through her femur, but is able to pivot and help get herself onto the table. ?*Personal history of colon polyps: She reports a colonoscopy 6 or 7 years ago where she had 23 polyps removed in [place], [place].  She says that that was her last colonoscopy.  We will try to obtain those records. ? ?**It looks like she also had an EGD  at [facility] in 01/2021.  Will try to obtain the results of that as well. ? ?**Addendum: EGD report obtained from [facility].  This was performed on 01/15/2021 and showed white coating of the esophagus concerning for Candida esophagitis and mild erosive gastritis.  Esophageal biopsies showed esophageal squamous mucosa with mild acute inflammation, no fungal organisms or viral inclusions and no eosinophils. ? ? ?CC:  Tawnya Crook, MD ? ?  ?

## 2021-09-21 ENCOUNTER — Encounter: Payer: Self-pay | Admitting: Family Medicine

## 2021-09-21 ENCOUNTER — Ambulatory Visit: Payer: Medicare Other | Admitting: Physical Medicine and Rehabilitation

## 2021-09-21 ENCOUNTER — Encounter: Payer: Self-pay | Admitting: Physical Medicine and Rehabilitation

## 2021-09-21 VITALS — BP 129/80 | HR 82

## 2021-09-21 DIAGNOSIS — R200 Anesthesia of skin: Secondary | ICD-10-CM | POA: Diagnosis not present

## 2021-09-21 DIAGNOSIS — R202 Paresthesia of skin: Secondary | ICD-10-CM

## 2021-09-21 DIAGNOSIS — M542 Cervicalgia: Secondary | ICD-10-CM | POA: Diagnosis not present

## 2021-09-21 DIAGNOSIS — Z89611 Acquired absence of right leg above knee: Secondary | ICD-10-CM

## 2021-09-21 DIAGNOSIS — M5412 Radiculopathy, cervical region: Secondary | ICD-10-CM

## 2021-09-21 MED ORDER — PREGABALIN 150 MG PO CAPS: 150.0000 mg | ORAL_CAPSULE | Freq: Three times a day (TID) | ORAL | 2 refills | Status: AC

## 2021-09-21 MED ORDER — HYDROMORPHONE HCL 2 MG PO TABS: 2.0000 mg | ORAL_TABLET | Freq: Four times a day (QID) | ORAL | 0 refills | Status: DC | PRN

## 2021-09-21 NOTE — Progress Notes (Signed)
? ?Renee Pitts - 61 y.o. female MRN [id number]  Date of birth: 01-13-1961 ? ?Office Visit Note: ?Visit Date: 09/21/2021 ?PCP: Tawnya Crook, MD ?Referred by: Newt Minion, MD ? ?Subjective: ?Chief Complaint  ?Patient presents with  ? Neck - Pain  ? ?HPI: Renee Pitts is a 61 y.o. female who comes in today at the request of Dr. Meridee Score for evaluation of chronic, worsening and severe bilateral neck pain radiating down to both hands. Patient reports symptoms have been going on for 9 months or more. Patient states onset of symptoms while residing in rehab facility where she was placed due to prolonged hospitalized from corneal abrasion/infection. States her symptoms are constant and cannot voice any aggravating factors at this time.  She describes her symptoms as a heaviness and tingling sensation, currently rates pain/tingling as 8 out of 10.  Patient states no relief of pain with conservative therapy such as rest and use of medications.  Patient is currently taking Lyrica and Zanaflex. She is also taking Dilaudid 2 mg PO for chronic pain that is currently prescribed by her primary care provider Dr. Clerance Lav. Patients recent cervical MRI exhibits multi-level facet hypertrophy and moderate to severe spinal canal stenosis at the level of C3-C4. Patient was previously seen by Dr. Juliene Pina at [facility] in 20[facility], per Dr. Welford Roche he instructed patient to follow up with [facility] given her complex history, he felt she would better benefit from treatment at tertiary care facility. Patient states she is not interested in having cervical injection at this time as she had an adverse reaction to steroid injection in the past.  ? ?Incidentally, patient also reports numbness and tingling from the waist down to her left foot.  Patient has history of right AKA. Patients lumbar MRI from 2022 exhibits small disc bulge at L5-S1 contacting descending S1 nerve root.  There is also mild bilateral foraminal narrowing at L5-S1. Patient was seen this past January by Dr. Desiree Hane with [facility]. Per Dr. Lurena Nida notes patient would benefit from consult with neurology to rule out peripheral neuropathy. Patient reports continued difficulty with transferring due to left leg/foot numbness. Patient does use wheelchair to assist with ambulation.  Patient denies recent trauma or falls.  ? ? ? ? ?Review of Systems  ?Musculoskeletal:  Positive for neck pain.  ?Neurological:  Positive for tingling. Negative for focal weakness and weakness.  ?All other systems reviewed and are negative. Otherwise per HPI. ? ?Assessment & Plan: ?Visit Diagnoses:  ?  ICD-10-CM   ?1. Radiculopathy, cervical region  M54.12   ?  ?2. Cervicalgia  M54.2   ?  ?3. Paresthesia of skin  R20.2   ?  ?4. Numbness and tingling of left leg  R20.0   ? R20.2   ?  ?5. Hx of AKA (above knee amputation), right ([place])  E33.295   ?  ?   ?Plan: Findings:  ?1. Chronic, worsening and severe bilateral neck pain radiating down to both hands and numbness from waist down to left foot.  Patient continues to have severe pain and numbness despite good conservative therapy such as rest and use of medications.  Patient's clinical presentation and exam are consistent with cervical radiculopathy, numbness/tingling does not follow specific dermatome and is more localized to both hands. We did speak with patient about performing diagnostic and hopefully therapeutic cervical epidural steroid injection, however patient does  not want to have injection at this time due to prior adverse reaction to steroid medication. We do feel patient could benefit from surgical consultation at tertiary care facility to discuss options, instructed to follow up with Dr. Sharol Given or primary care provider as needed. No red flag symptoms noted upon exam today.  ? ?2. Chronic, worsening and severe numbness and  tingling from waist down to left foot. Patients pain pattern is non dermatomal in nature. Her symptoms do not directly correlate with lumbar MRI findings. We do not recommend lumbar epidural steroid injection at this time. As stated above patient would be better suited with surgical referral at tertiary care facility.   ? ?Meds & Orders: No orders of the defined types were placed in this encounter. ? No orders of the defined types were placed in this encounter. ?  ?Follow-up: Return if symptoms worsen or fail to improve.  ? ?Procedures: ?No procedures performed  ?   ? ?Clinical History: ?EXAM: ?MRI CERVICAL SPINE WITHOUT CONTRAST ?  ?TECHNIQUE: ?Multiplanar, multisequence MR imaging of the cervical spine was ?performed. No intravenous contrast was administered. ?  ?COMPARISON:  None. ?  ?FINDINGS: ?Alignment: Straightening of the normal cervical lordosis. No ?substantial sagittal subluxation. ?  ?Vertebrae: Vertebral body heights are maintained. No focal marrow ?edema chest the fracture or discitis/osteomyelitis. No suspicious ?bone lesions. ?  ?Cord: Normal cord signal. ?  ?Posterior Fossa, vertebral arteries, paraspinal tissues: Visualized ?vertebral artery flow voids are maintained. No acute abnormality in ?the visualized posterior fossa. No paraspinal edema. ?  ?Disc levels: ?  ?C2-C3: Small posterior disc osteophyte complex and mild left greater ?than right facet and uncovertebral hypertrophy. Mild left foraminal ?stenosis. No significant canal or right foraminal stenosis. ?  ?C3-C4: Posterior disc osteophyte complex with ligamentum flavum ?thickening and bilateral facet and uncovertebral hypertrophy. ?Resulting moderate to severe canal stenosis and right greater than ?left foraminal stenosis. ?  ?C4-C5: Posterior disc osteophyte complex with bilateral facet ?uncovertebral hypertrophy. Resulting moderate canal stenosis with ?severe bilateral foraminal stenosis. ?  ?C5-C6: Posterior disc osteophyte complex with  right greater than ?left facet and uncovertebral hypertrophy. Resulting moderate to ?severe bilateral foraminal stenosis with mild canal stenosis. ?  ?C6-C7: Posterior disc osteophyte complex with bilateral facet ?uncovertebral hypertrophy. Resulting moderate bilateral foraminal ?stenosis without significant canal stenosis. ?  ?C7-T1: Right greater than left facet and uncovertebral hypertrophy ?resulting moderate right foraminal stenosis. Mild left foraminal ?stenosis. No significant canal stenosis. ?  ?IMPRESSION: ?1. At C4-C5, severe bilateral foraminal stenosis with moderate canal ?stenosis. ?2. At C3-C4, moderate to severe canal stenosis and right greater ?than left foraminal stenosis. ?3. At C5-C6, moderate to severe bilateral foraminal stenosis with ?mild canal stenosis. ?4. At C6-C7, moderate bilateral foraminal stenosis. ?5. At C7-T1, moderate right foraminal stenosis. ?  ?  ?Electronically Signed ?  By: Margaretha Sheffield M.D. ?  On: 09/03/2021 13:53  ? ?She reports that she has been smoking cigarettes. She has a 15.00 pack-year smoking history. She has never used smokeless tobacco. No results for input(s): HGBA1C, LABURIC in the last 8760 hours. ? ?Objective:  VS:  HT:    WT:   BMI:     BP:129/80  HR:82bpm  TEMP: ( )  RESP:  ?Physical Exam ?Vitals and nursing note reviewed.  ?HENT:  ?   Head: Normocephalic and atraumatic.  ?   Right Ear: External ear normal.  ?   Left Ear: External ear normal.  ?   Nose: Nose normal.  ?  Mouth/Throat:  ?   Mouth: Mucous membranes are moist.  ?Eyes:  ?   Extraocular Movements: Extraocular movements intact.  ?Cardiovascular:  ?   Rate and Rhythm: Normal rate.  ?   Pulses: Normal pulses.  ?Pulmonary:  ?   Effort: Pulmonary effort is normal.  ?Abdominal:  ?   General: Abdomen is flat. There is no distension.  ?Musculoskeletal:     ?   General: Tenderness present.  ?   Cervical back: Tenderness present.  ?   Comments: No discomfort noted with flexion, extension and  side-to-side rotation. Patient has good strength in the upper extremities including 5 out of 5 strength in wrist extension, long finger flexion and APB.  There is no atrophy of the hands intrinsically. Decreased sensa

## 2021-09-21 NOTE — Progress Notes (Signed)
Numeric Pain Rating Scale and Functional Assessment ?Average Pain 6 ? ? ?In the last MONTH (on 0-10 scale) has pain interfered with the following? ? ?1. General activity like being  able to carry out your everyday physical activities such as walking, climbing stairs, carrying groceries, or moving a chair?  ?Rating(6) ? ?Patient is here to review MRI C-spine. +Numbness. Takes Dilaudid for pain. Hurts when doing movements. No previous injections. ? ? ?

## 2021-09-21 NOTE — Addendum Note (Signed)
Addended by: Wellington Hampshire on: 09/21/2021 04:29 PM ? ? Modules accepted: Orders ? ?

## 2021-09-28 ENCOUNTER — Other Ambulatory Visit: Payer: Self-pay | Admitting: Neurosurgery

## 2021-09-28 DIAGNOSIS — M47814 Spondylosis without myelopathy or radiculopathy, thoracic region: Secondary | ICD-10-CM

## 2021-10-01 ENCOUNTER — Encounter: Payer: Self-pay | Admitting: Gastroenterology

## 2021-10-01 NOTE — Progress Notes (Signed)
Agree with the assessment and plan as outlined by Jessica Zehr, PA-C. ? ?Jaiona Simien, DO, FACG ? ?

## 2021-10-02 ENCOUNTER — Encounter: Payer: Self-pay | Admitting: Certified Registered Nurse Anesthetist

## 2021-10-03 ENCOUNTER — Ambulatory Visit
Admission: RE | Admit: 2021-10-03 | Discharge: 2021-10-03 | Disposition: A | Discharge status: Home / Self Care | Payer: Medicare Other | Source: Ambulatory Visit | Attending: Neurosurgery | Admitting: Neurosurgery

## 2021-10-03 DIAGNOSIS — M47814 Spondylosis without myelopathy or radiculopathy, thoracic region: Secondary | ICD-10-CM

## 2021-10-03 IMAGING — MR MR THORACIC SPINE W/O CM
4 of 6 series · 17 of 48 positions shown · non-contrast
Comparison: None Available.

CLINICAL DATA: Back pain, bilateral arm numbness

EXAM:
MRI THORACIC SPINE WITHOUT CONTRAST
TECHNIQUE: Multiplanar, multisequence MR imaging of the thoracic spine was
performed. No intravenous contrast was administered.

[Series 2: T1 · sagittal · 4.0mm · 1.25mm/px · 3 of 11 slices shown (1 of 2)]
[im 1/11]
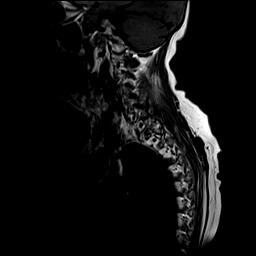
[im 6/11]
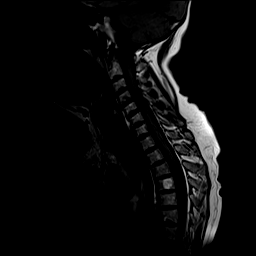
[im 11/11]
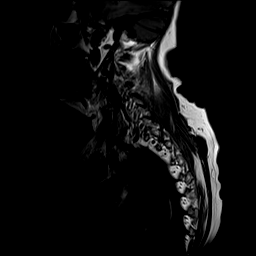

[Series 5: T2 · sagittal · 3.0mm · 0.55mm/px · 6 of 17 slices shown (1 of 2)]
[im 1/17]
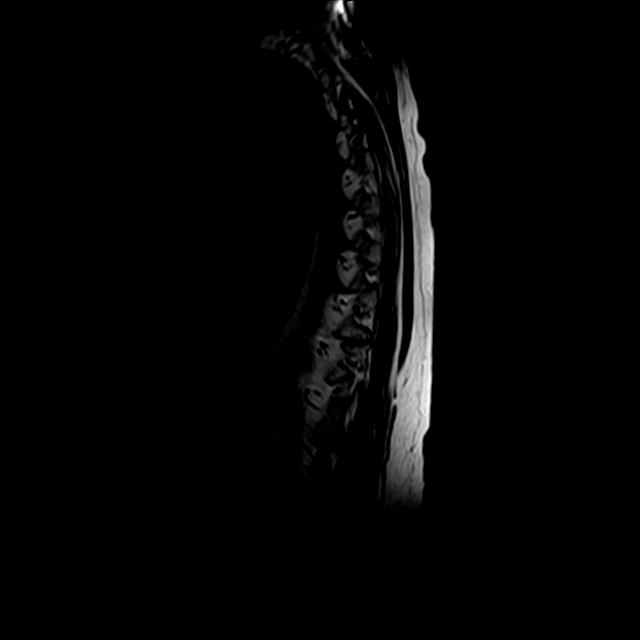
[im 4/17]
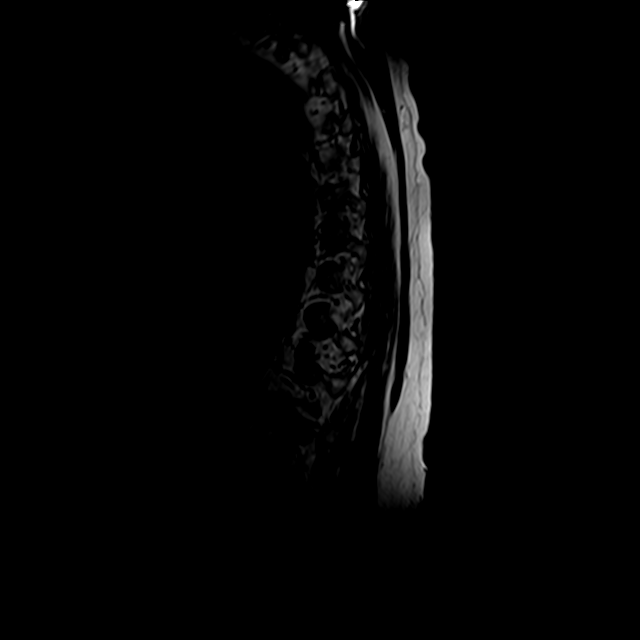
[im 7/17]
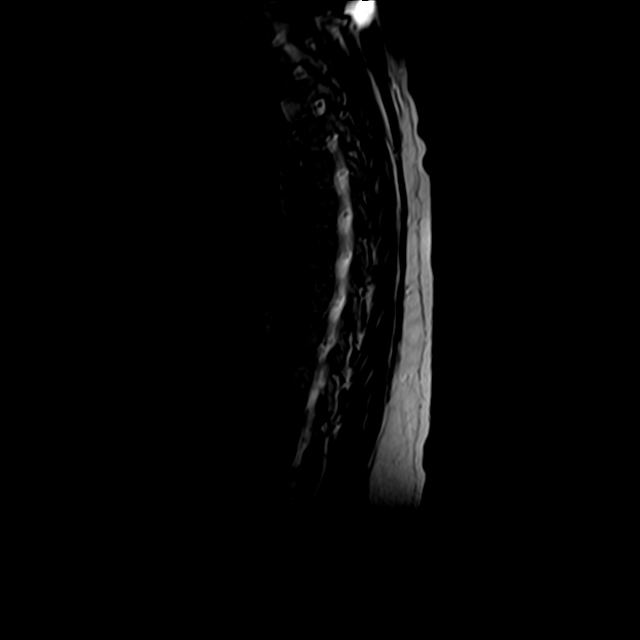
[im 10/17]
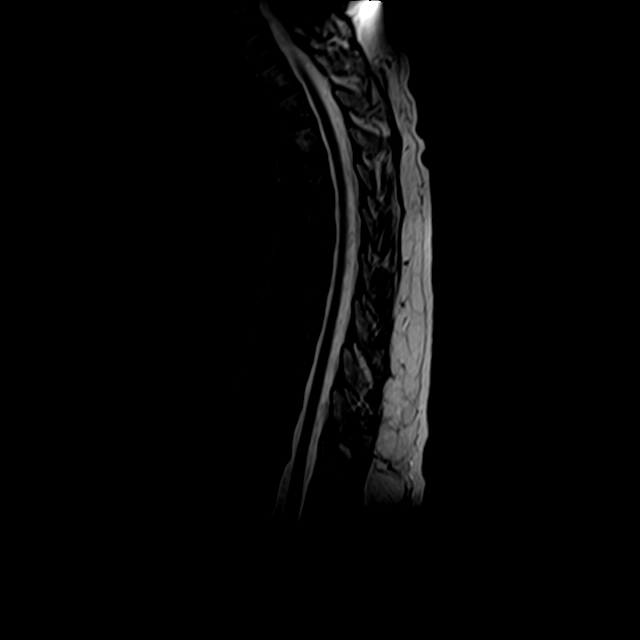
[im 13/17]
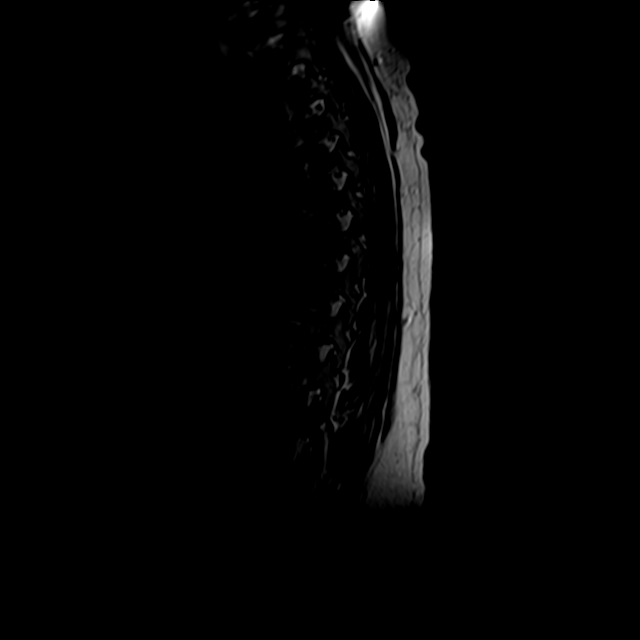
[im 17/17]
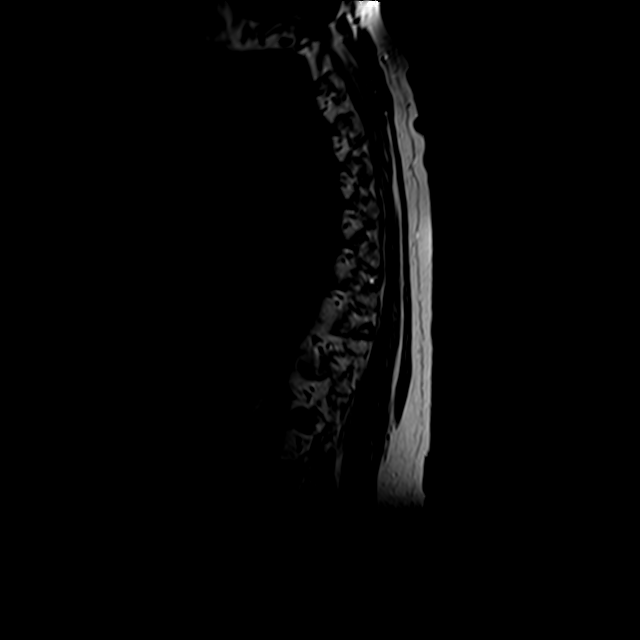

[Series 6: T1 · sagittal · 3.0mm · 0.55mm/px · 3 of 17 slices shown (2 of 2)]
[im 4/17]
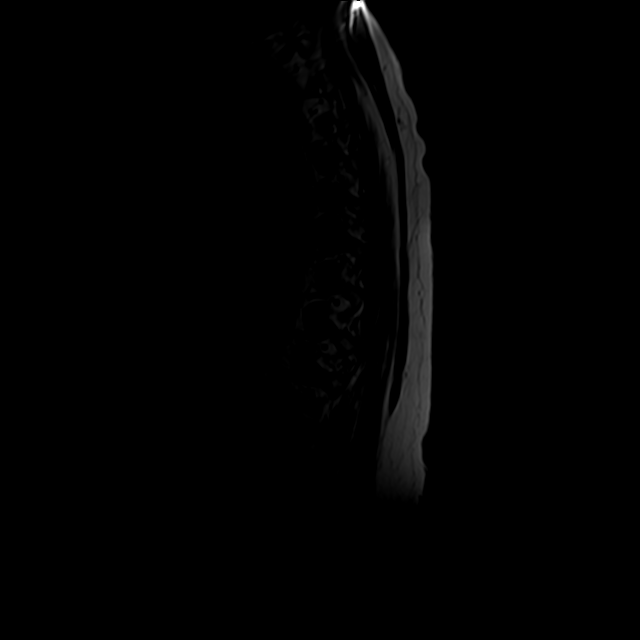
[im 10/17]
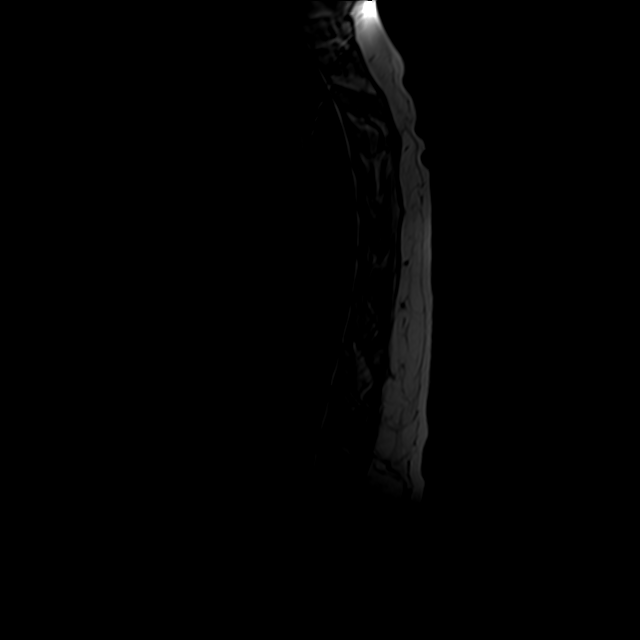
[im 17/17]
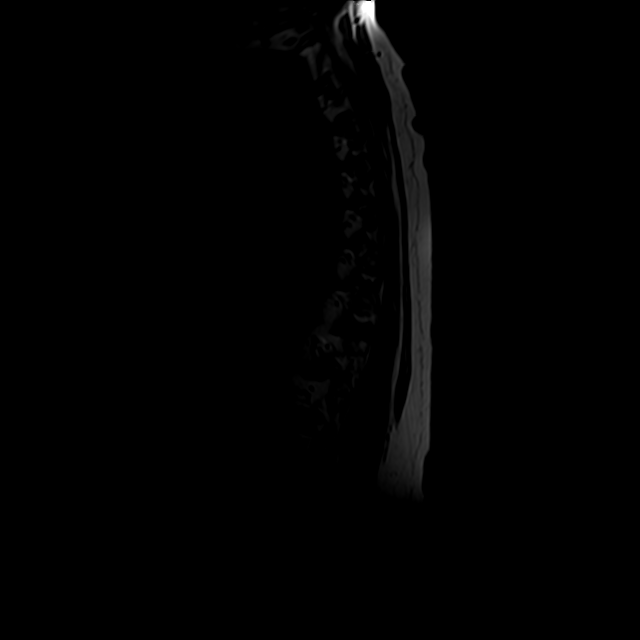

[Series 7: T2 · axial · 4.0mm · 0.39mm/px · z∈[-307,-80]mm · 5 of 42 slices shown (2 of 2)]
[im 1/42]
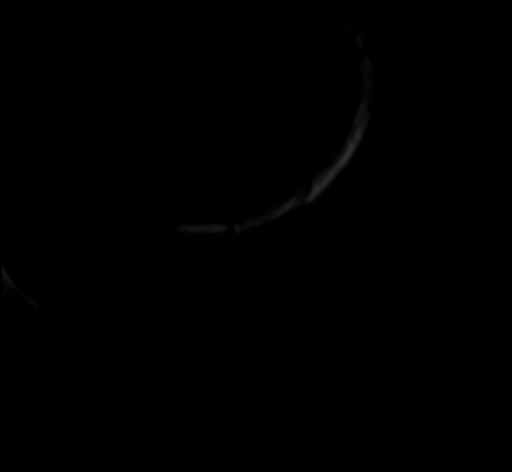
[im 7/42]
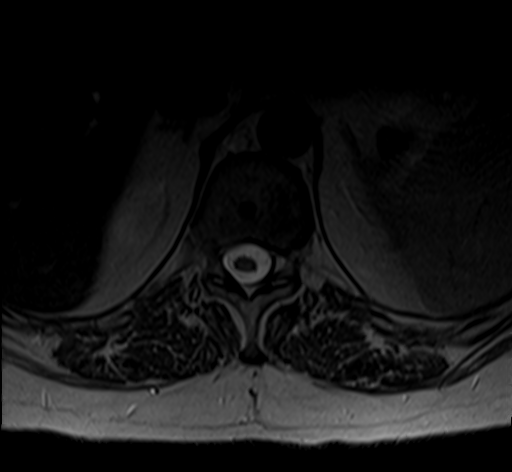
[im 13/42]
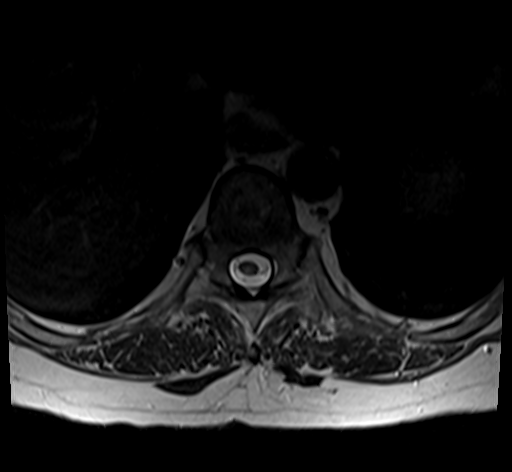
[im 23/42]
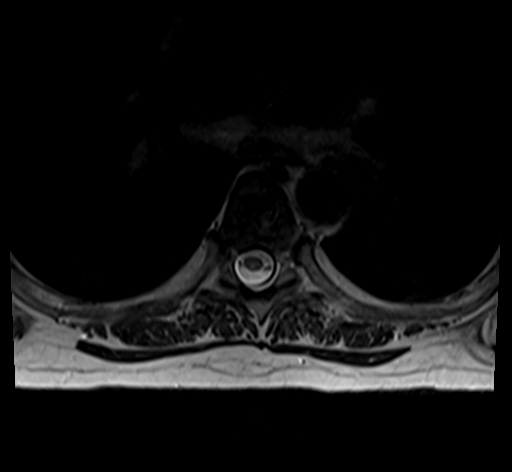
[im 35/42]
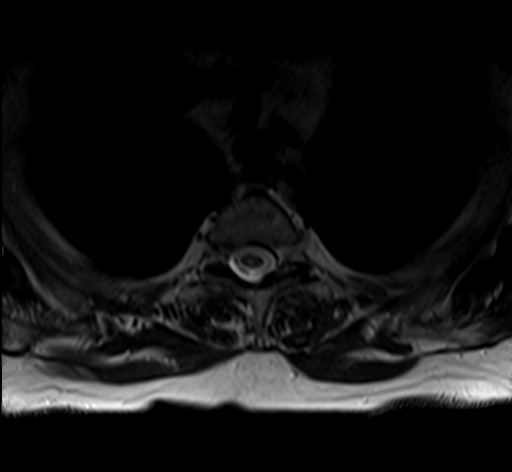

[17 of 48 positions shown; findings below may reference images not displayed]

FINDINGS: Alignment:  Physiologic.

Vertebrae: No acute fracture, evidence of discitis, or aggressive
bone lesion. T4 vertebral body hemangioma. Chronic L2 vertebral body
compression fracture.

Cord:  Normal signal and morphology.

Paraspinal and other soft tissues: No acute paraspinal abnormality.

Disc levels:

Disc spaces: Disc spaces are maintained. Mild disc desiccation
throughout the thoracic spine.

T1-T2: No disc protrusion, foraminal stenosis or central canal
stenosis.

T2-T3: No disc protrusion, foraminal stenosis or central canal
stenosis.

T3-T4: No disc protrusion, foraminal stenosis or central canal
stenosis.

T4-T5: No disc protrusion, foraminal stenosis or central canal
stenosis.

T5-T6: No disc protrusion, foraminal stenosis or central canal
stenosis.

T6-T7: No disc protrusion, foraminal stenosis or central canal
stenosis.

T7-T8: No disc protrusion, foraminal stenosis or central canal
stenosis.

T8-T9: No disc protrusion, foraminal stenosis or central canal
stenosis.

T9-T10: No disc protrusion, foraminal stenosis or central canal
stenosis.

T10-T11: No disc protrusion, foraminal stenosis or central canal
stenosis.

T11-T12: No disc protrusion, foraminal stenosis or central canal
stenosis.

L1-2: Mild broad-based disc bulge. No foraminal or central canal
stenosis.
IMPRESSION: 1. No acute osseous injury of the thoracic spine.
2. No significant thoracic spine disc protrusion, foraminal stenosis
or central canal stenosis.

## 2021-10-05 ENCOUNTER — Emergency Department (HOSPITAL_COMMUNITY)
Admission: EM | Admit: 2021-10-05 | Discharge: 2021-10-05 | Disposition: A | Discharge status: Home / Self Care | Payer: Medicare Other | Attending: Emergency Medicine | Admitting: Emergency Medicine

## 2021-10-05 ENCOUNTER — Emergency Department (HOSPITAL_COMMUNITY): Payer: Medicare Other

## 2021-10-05 ENCOUNTER — Encounter (HOSPITAL_COMMUNITY): Payer: Self-pay

## 2021-10-05 DIAGNOSIS — M79631 Pain in right forearm: Secondary | ICD-10-CM | POA: Insufficient documentation

## 2021-10-05 DIAGNOSIS — Z89521 Acquired absence of right knee: Secondary | ICD-10-CM | POA: Insufficient documentation

## 2021-10-05 DIAGNOSIS — R0602 Shortness of breath: Secondary | ICD-10-CM | POA: Diagnosis not present

## 2021-10-05 DIAGNOSIS — M79632 Pain in left forearm: Secondary | ICD-10-CM | POA: Diagnosis not present

## 2021-10-05 DIAGNOSIS — R102 Pelvic and perineal pain: Secondary | ICD-10-CM | POA: Diagnosis not present

## 2021-10-05 DIAGNOSIS — M25572 Pain in left ankle and joints of left foot: Secondary | ICD-10-CM | POA: Diagnosis not present

## 2021-10-05 DIAGNOSIS — M25562 Pain in left knee: Secondary | ICD-10-CM | POA: Insufficient documentation

## 2021-10-05 DIAGNOSIS — W19XXXA Unspecified fall, initial encounter: Secondary | ICD-10-CM

## 2021-10-05 IMAGING — CR DG FOREARM 2V*R*
3 series · 3 of 3 positions shown · non-contrast
Comparison: None Available.

CLINICAL DATA: Pain after a fall.

EXAM:
RIGHT FOREARM - 2 VIEW

[x forearm ap right]
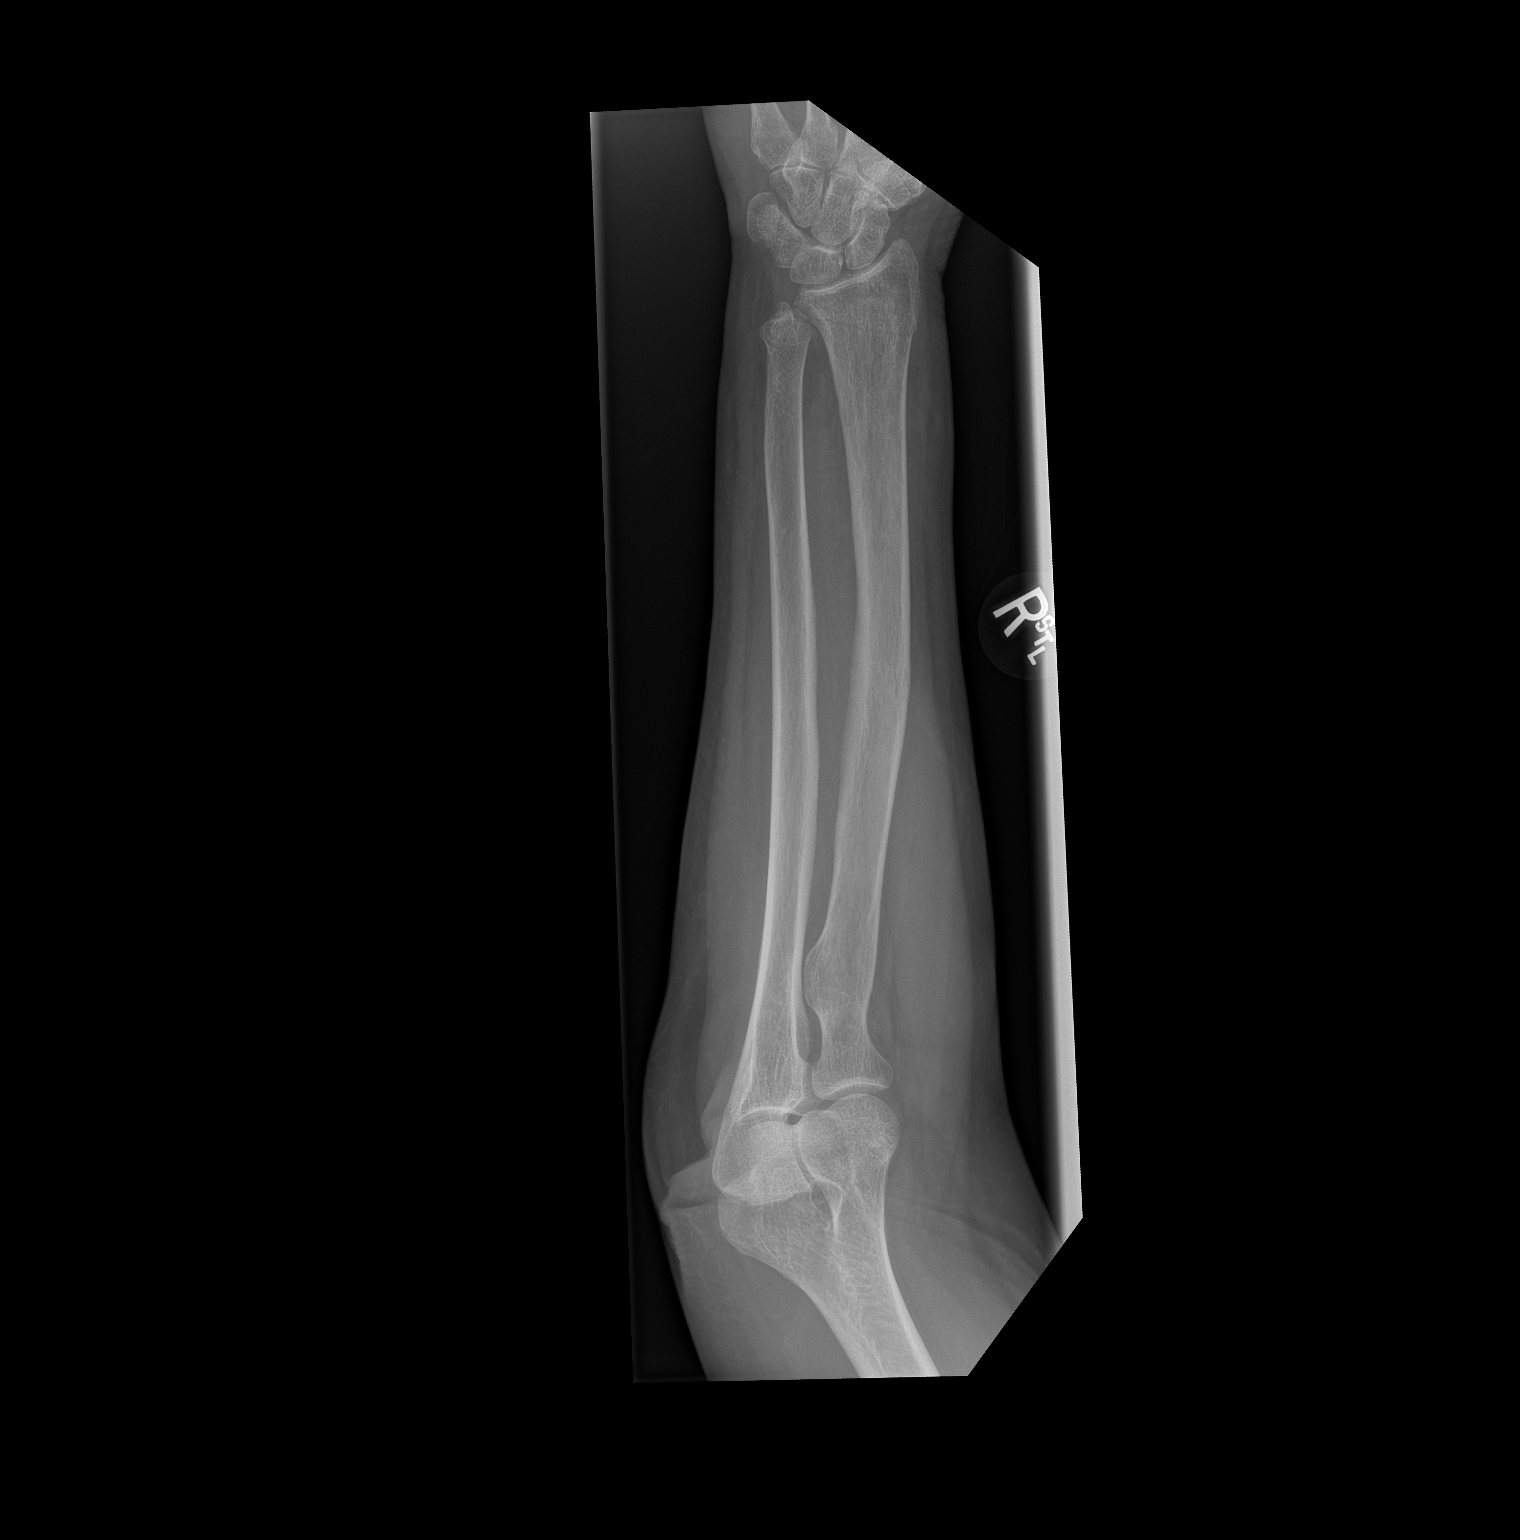

[x forearm lat right (1 of 2)]
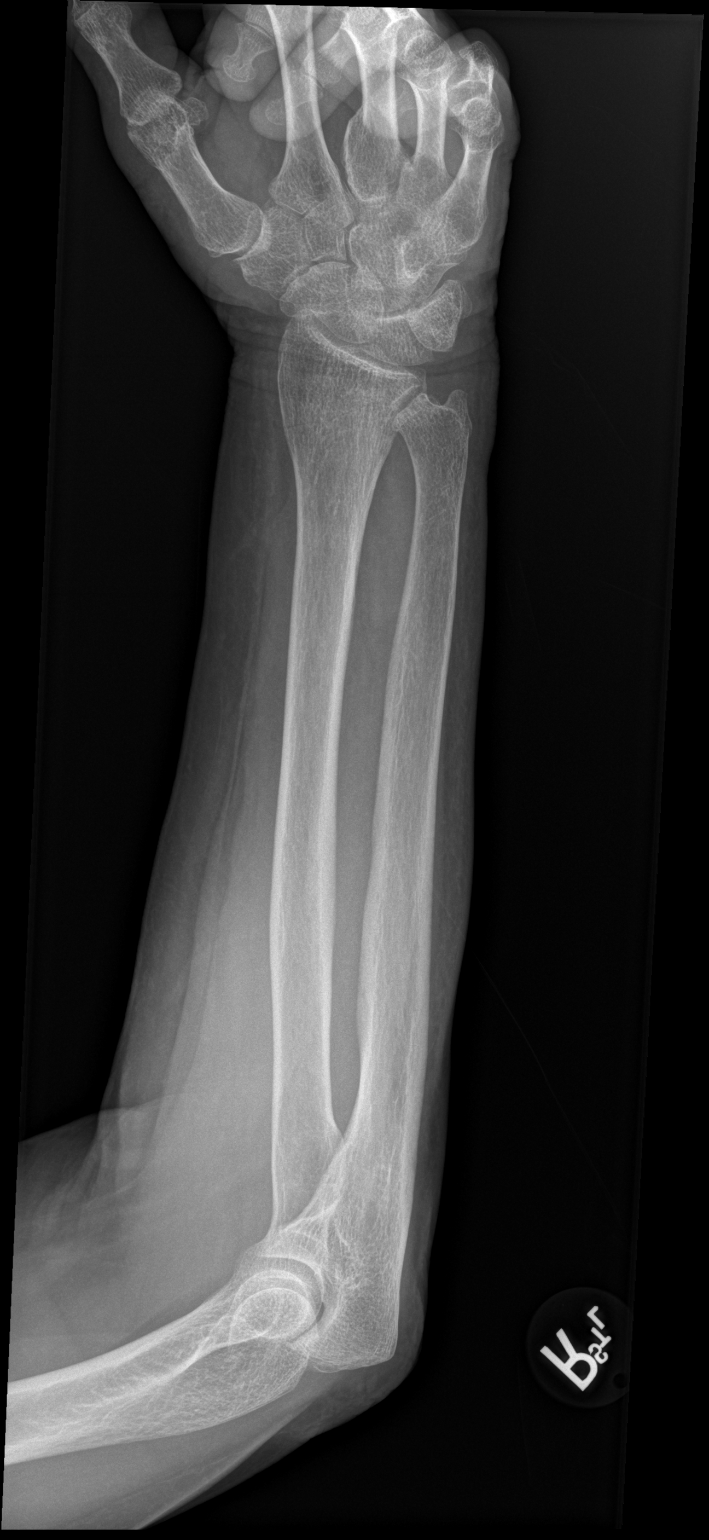

[x forearm lat right (2 of 2)]
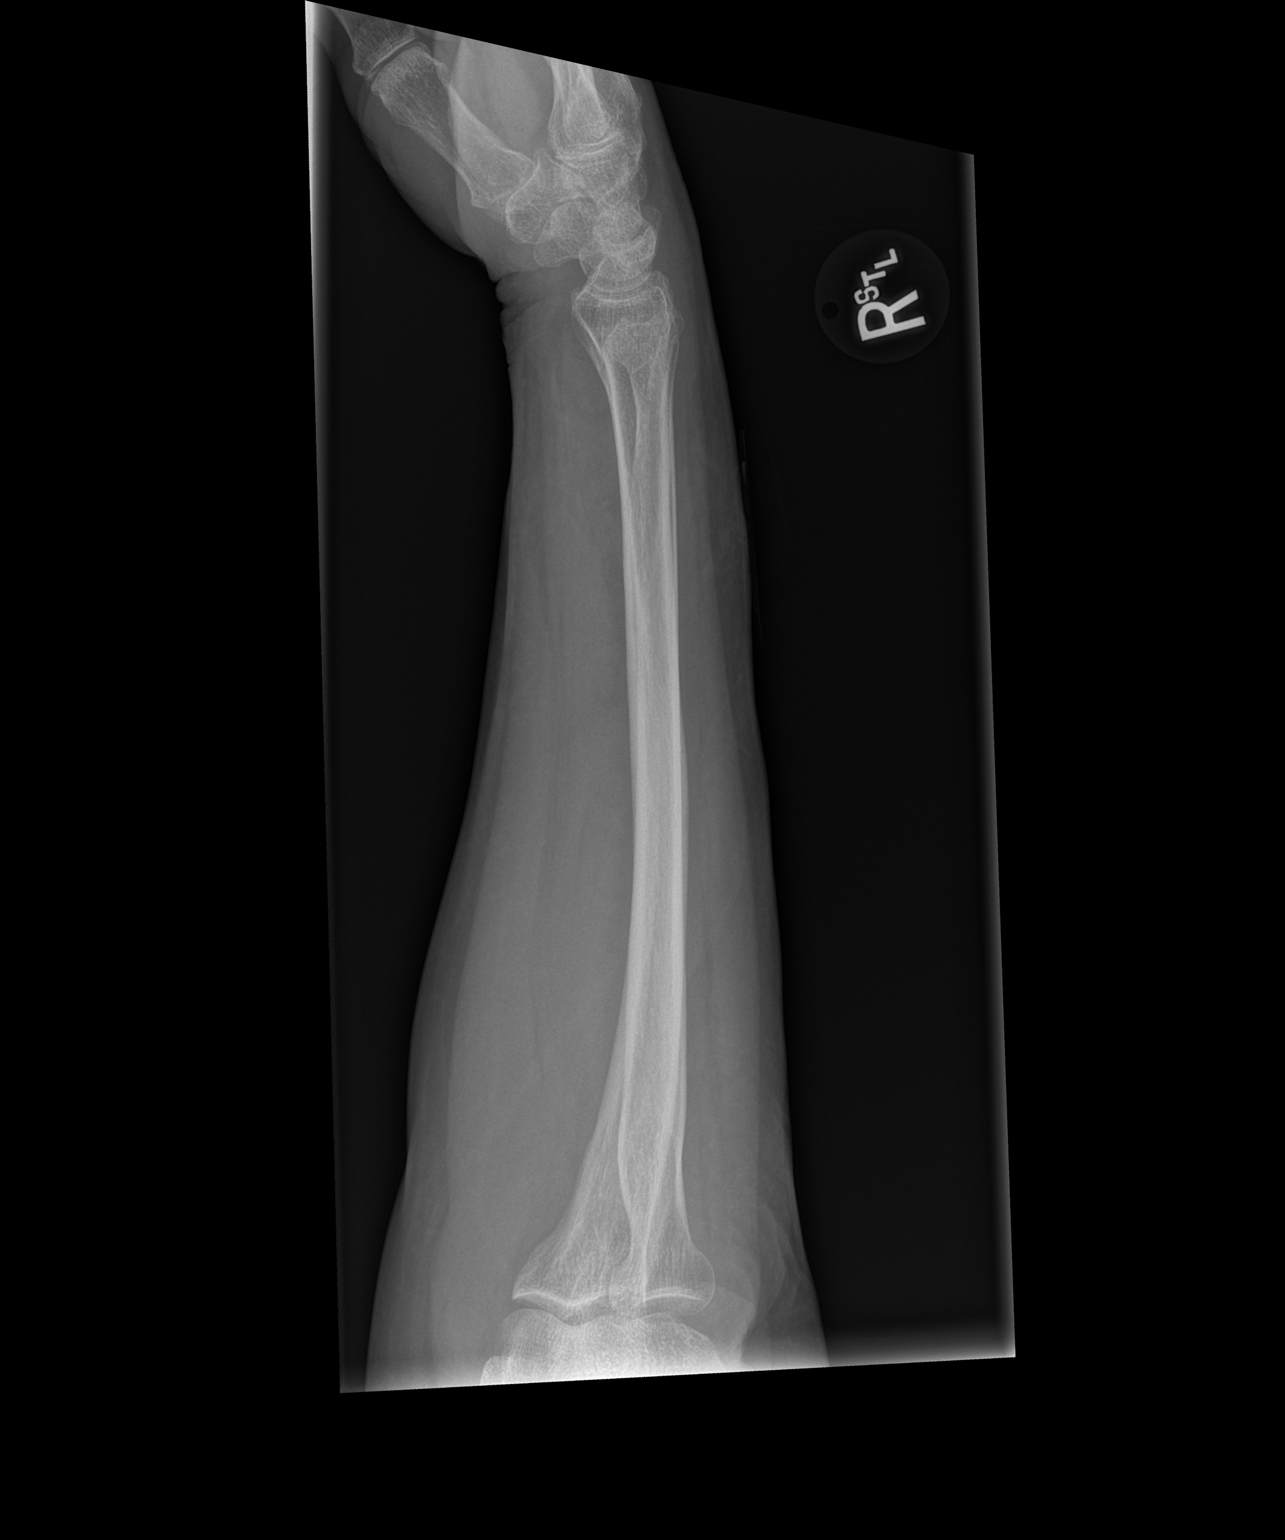

[3 of 3 positions shown; findings below may reference images not displayed]

FINDINGS: There is an oblique linear lucency along the base of the distal
right radial metaphysis suggesting a nondisplaced fracture. Possible
fracture of the ulnar styloid process as well. Right wrist views may
be useful for further evaluation. The proximal radius and ulna
appear intact and there is no dislocation. Soft tissues are
unremarkable.
IMPRESSION: Probable nondisplaced fractures of the distal right radial
metaphysis and ulnar styloid process.

## 2021-10-05 IMAGING — CR DG ANKLE COMPLETE 3+V*L*
3 series · 3 of 3 positions shown · non-contrast
Comparison: None Available.

CLINICAL DATA: Fell, left ankle pain

EXAM:
LEFT ANKLE COMPLETE - 3+ VIEW

[x ankle lat left]
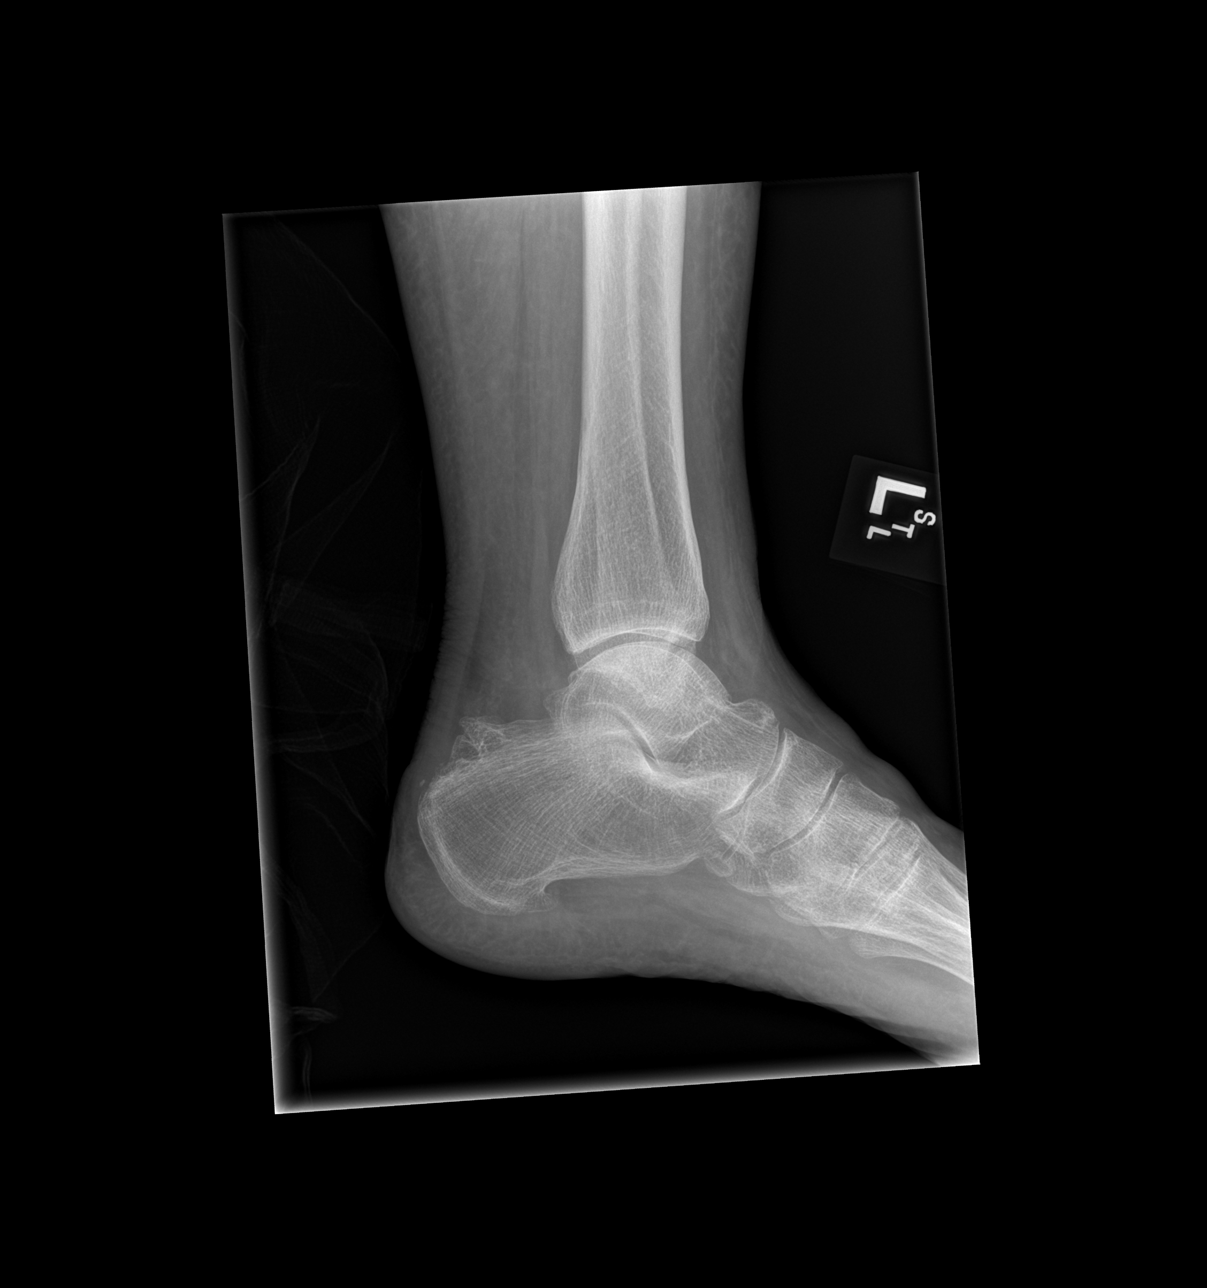

[x ankle ap left]
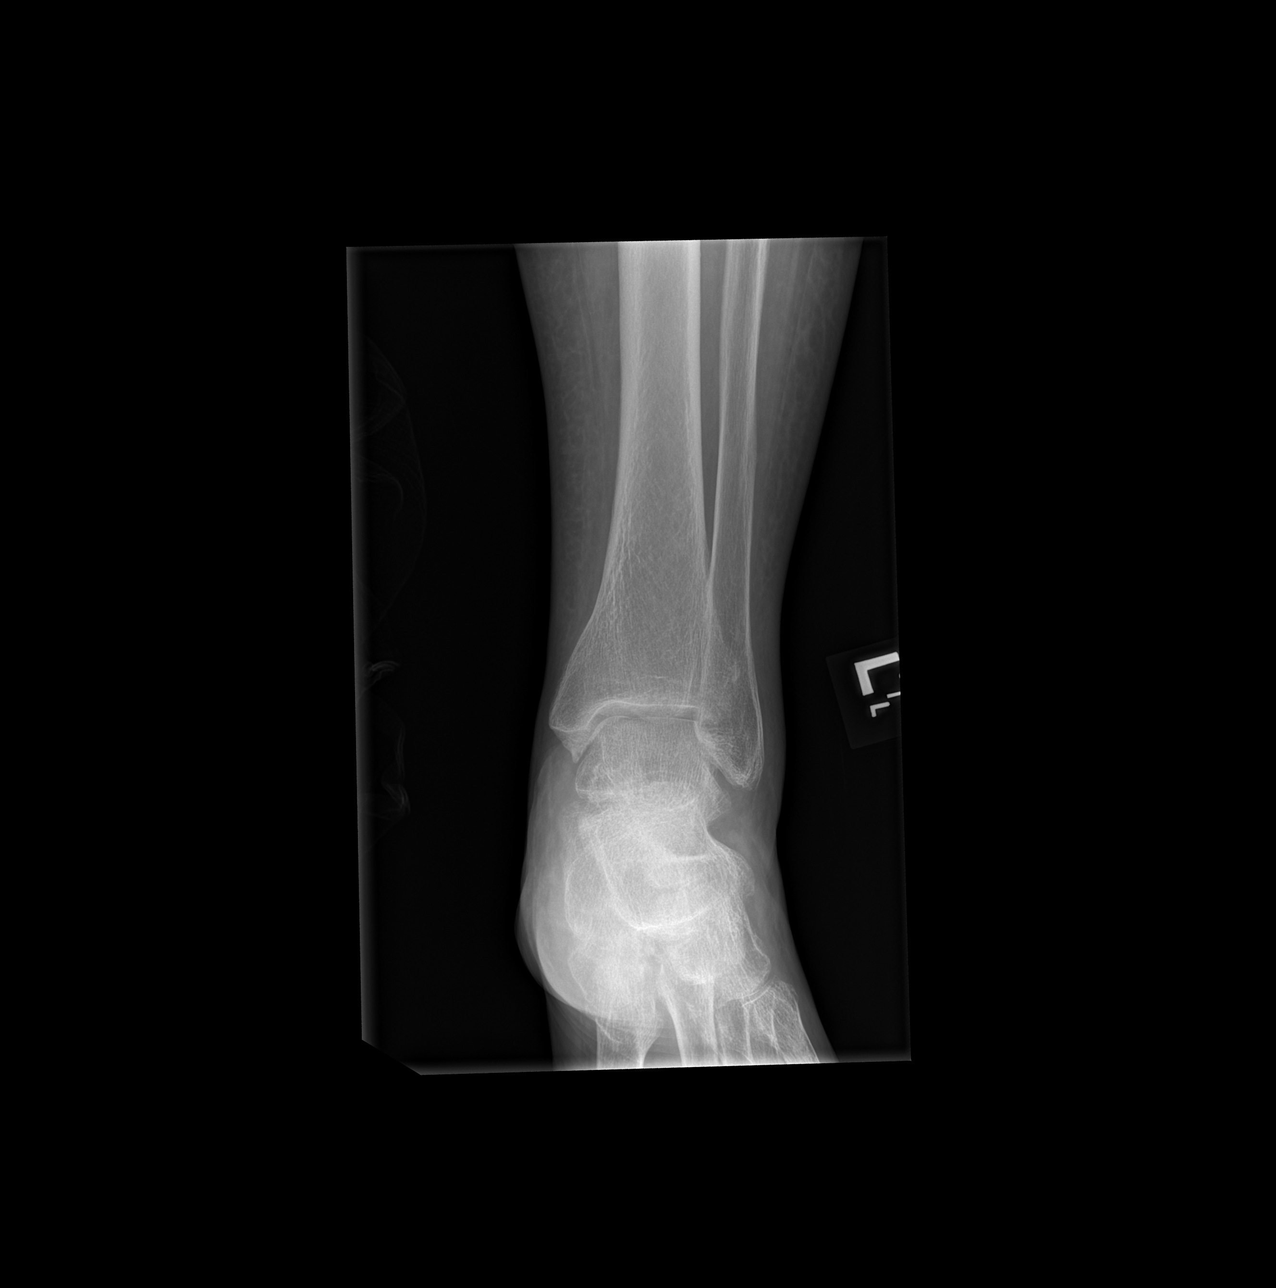

[x ankle obl left]
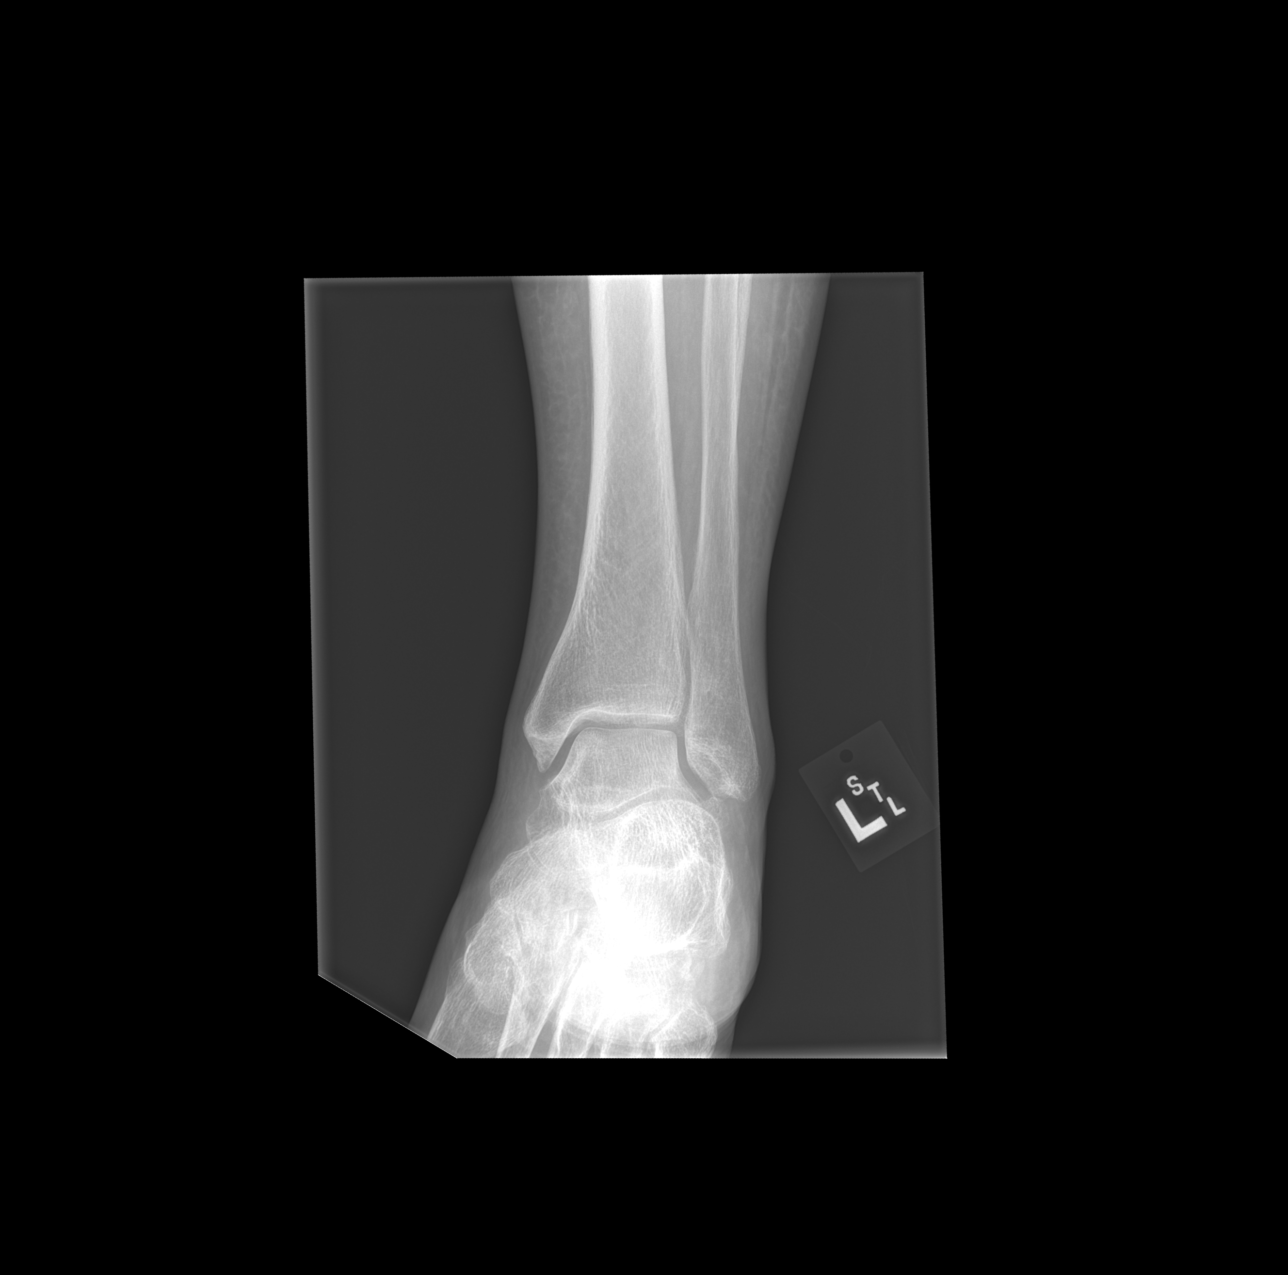

[3 of 3 positions shown; findings below may reference images not displayed]

FINDINGS: Frontal, oblique, and lateral views of the left ankle are obtained.
No acute displaced fracture, subluxation, or dislocation.
Enthesopathic changes versus sequela of prior healed trauma at the
superior margin of the calcaneus. Joint spaces are relatively well
preserved. Soft tissues are unremarkable.
IMPRESSION: 1. No acute bony abnormality.

## 2021-10-05 IMAGING — CR DG HIP (WITH OR WITHOUT PELVIS) 2-3V*L*
3 series · 3 of 3 positions shown · non-contrast
Comparison: CT [DATE]

CLINICAL DATA: History of fall

EXAM:
DG HIP (WITH OR WITHOUT PELVIS) 2-3V LEFT

[x pelvis]
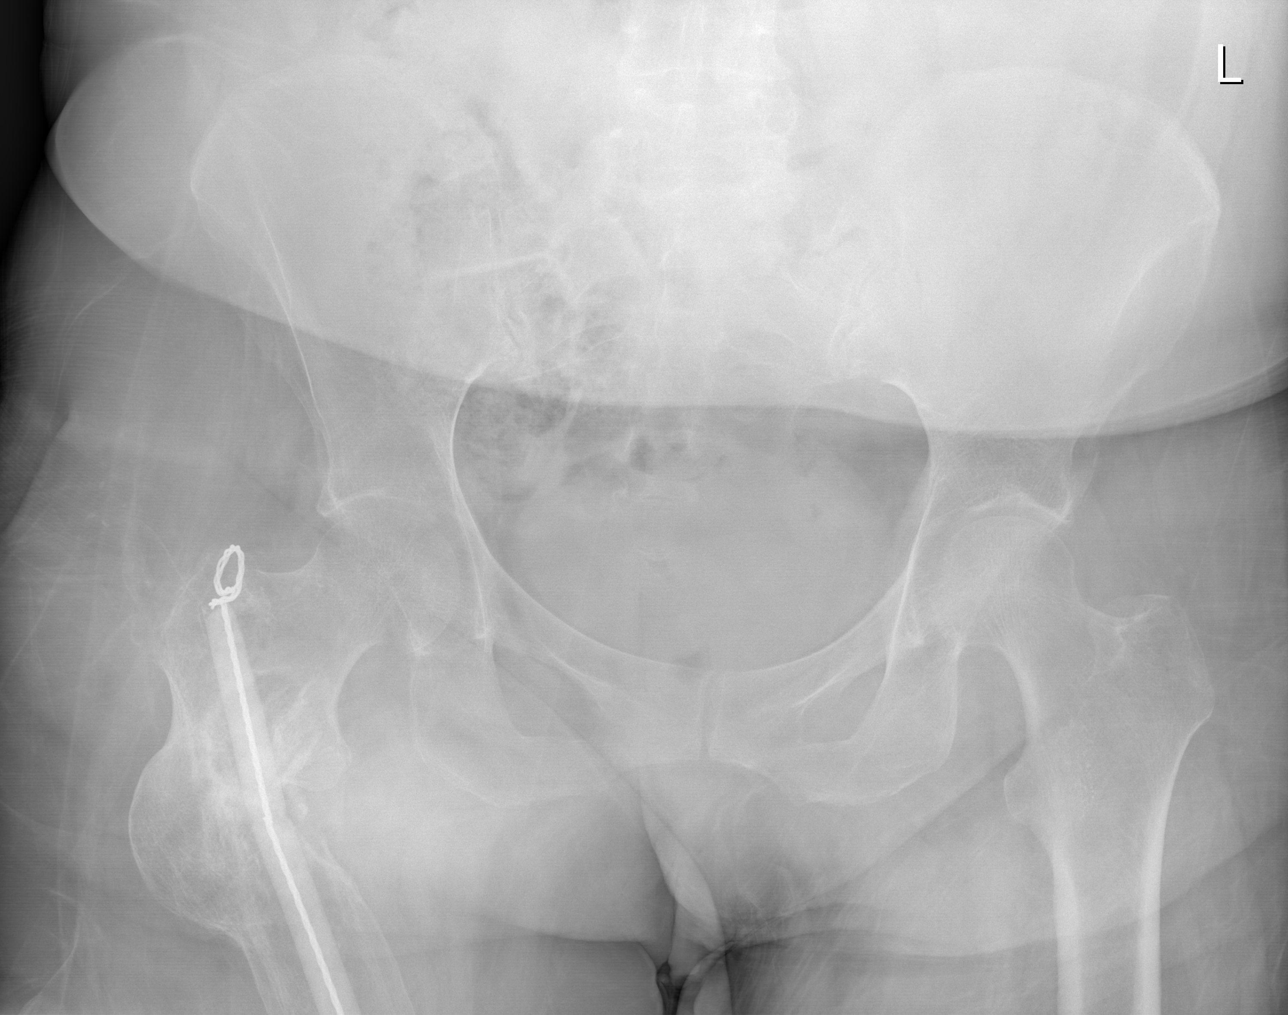

[x hip ap left]
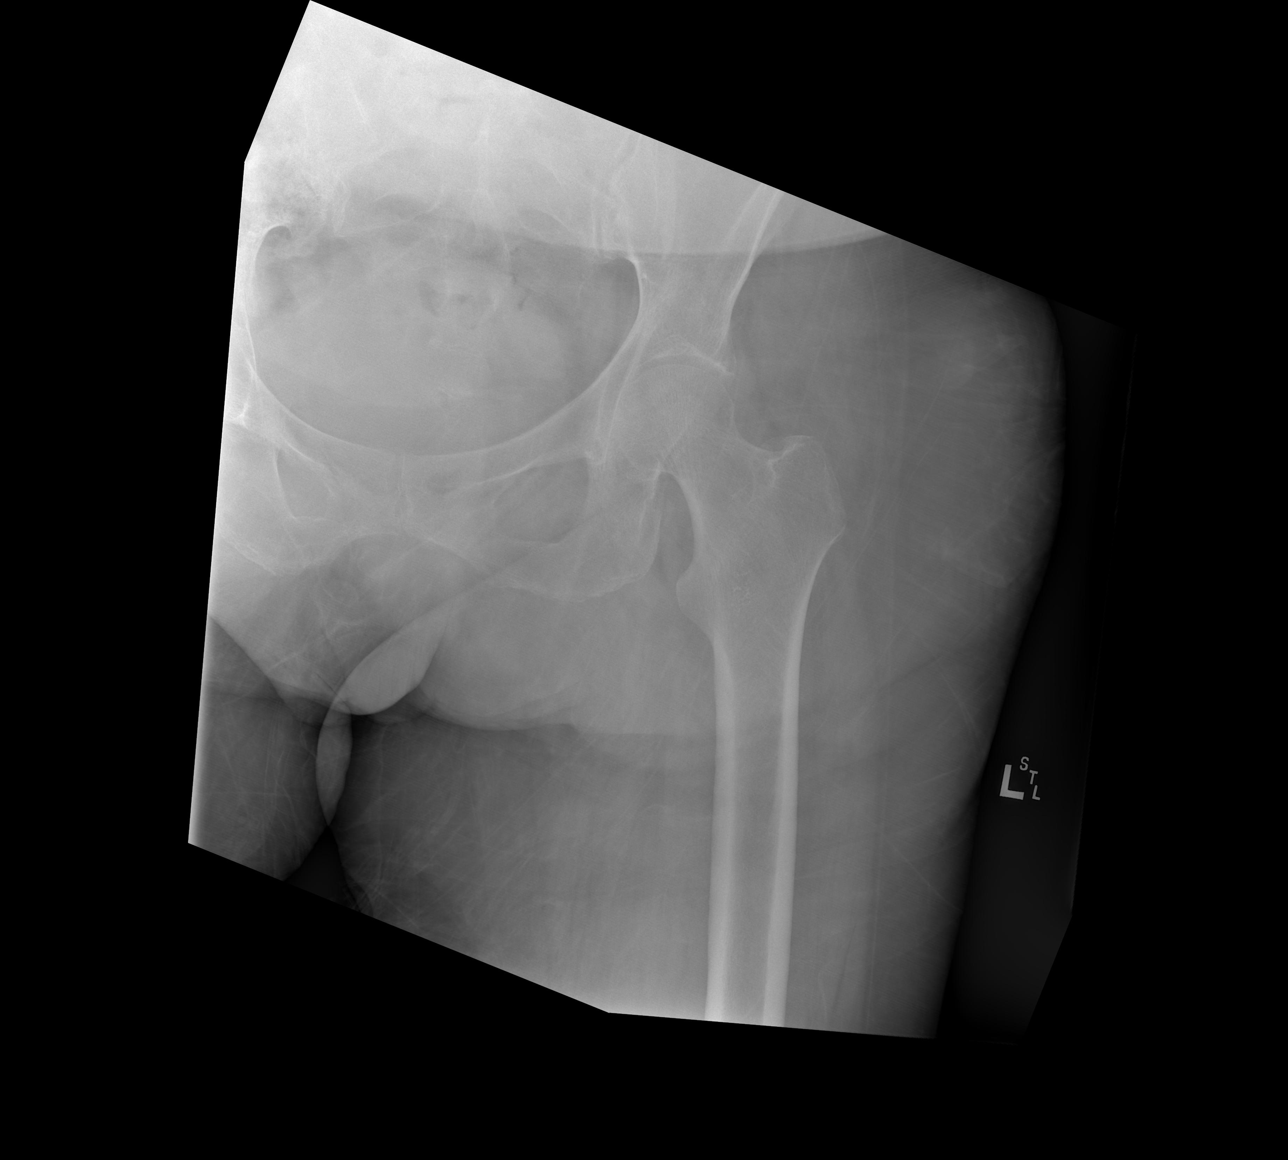

[x hip lat left]
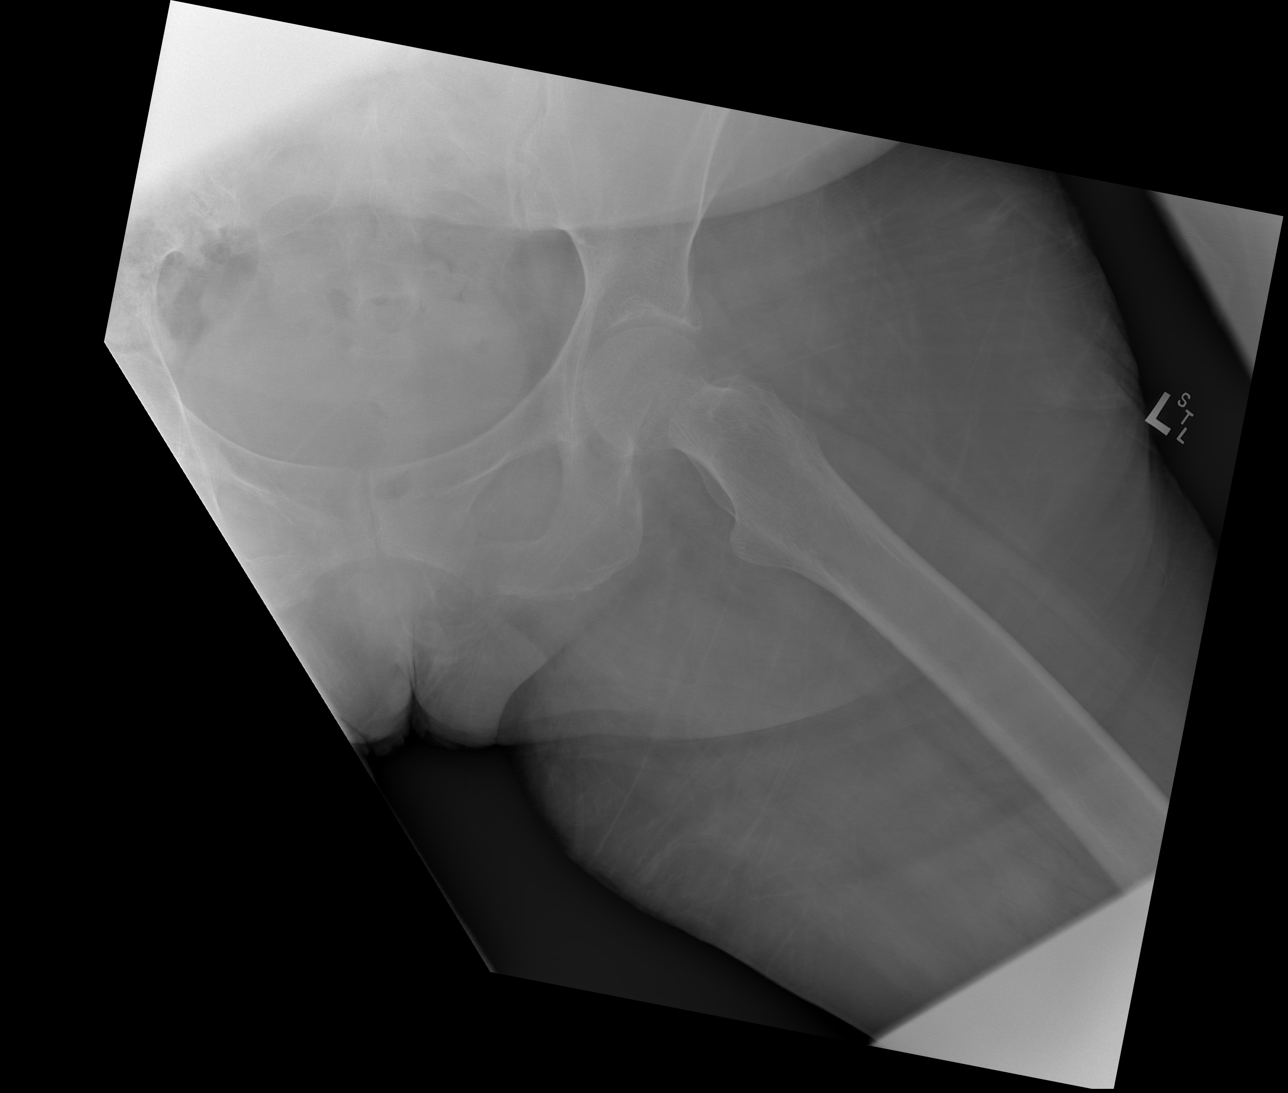

[3 of 3 positions shown; findings below may reference images not displayed]

FINDINGS: Pubic symphysis and rami appear intact. No fracture or malalignment
on the left. Chronic postoperative and posttraumatic deformity of
the proximal right femur. Chronic fracture of right intramedullary
hardware with similar appearance of lucency surrounding the
hardware.
IMPRESSION: 1. No acute osseous abnormality
2. Chronic deformity of the proximal right femur and stable
appearance of the right femoral hardware.

## 2021-10-05 IMAGING — CR DG FOREARM 2V*L*
3 series · 3 of 3 positions shown · non-contrast
Comparison: None Available.

CLINICAL DATA: Fell out of wheelchair

EXAM:
LEFT FOREARM - 2 VIEW

[x forearm ap left]
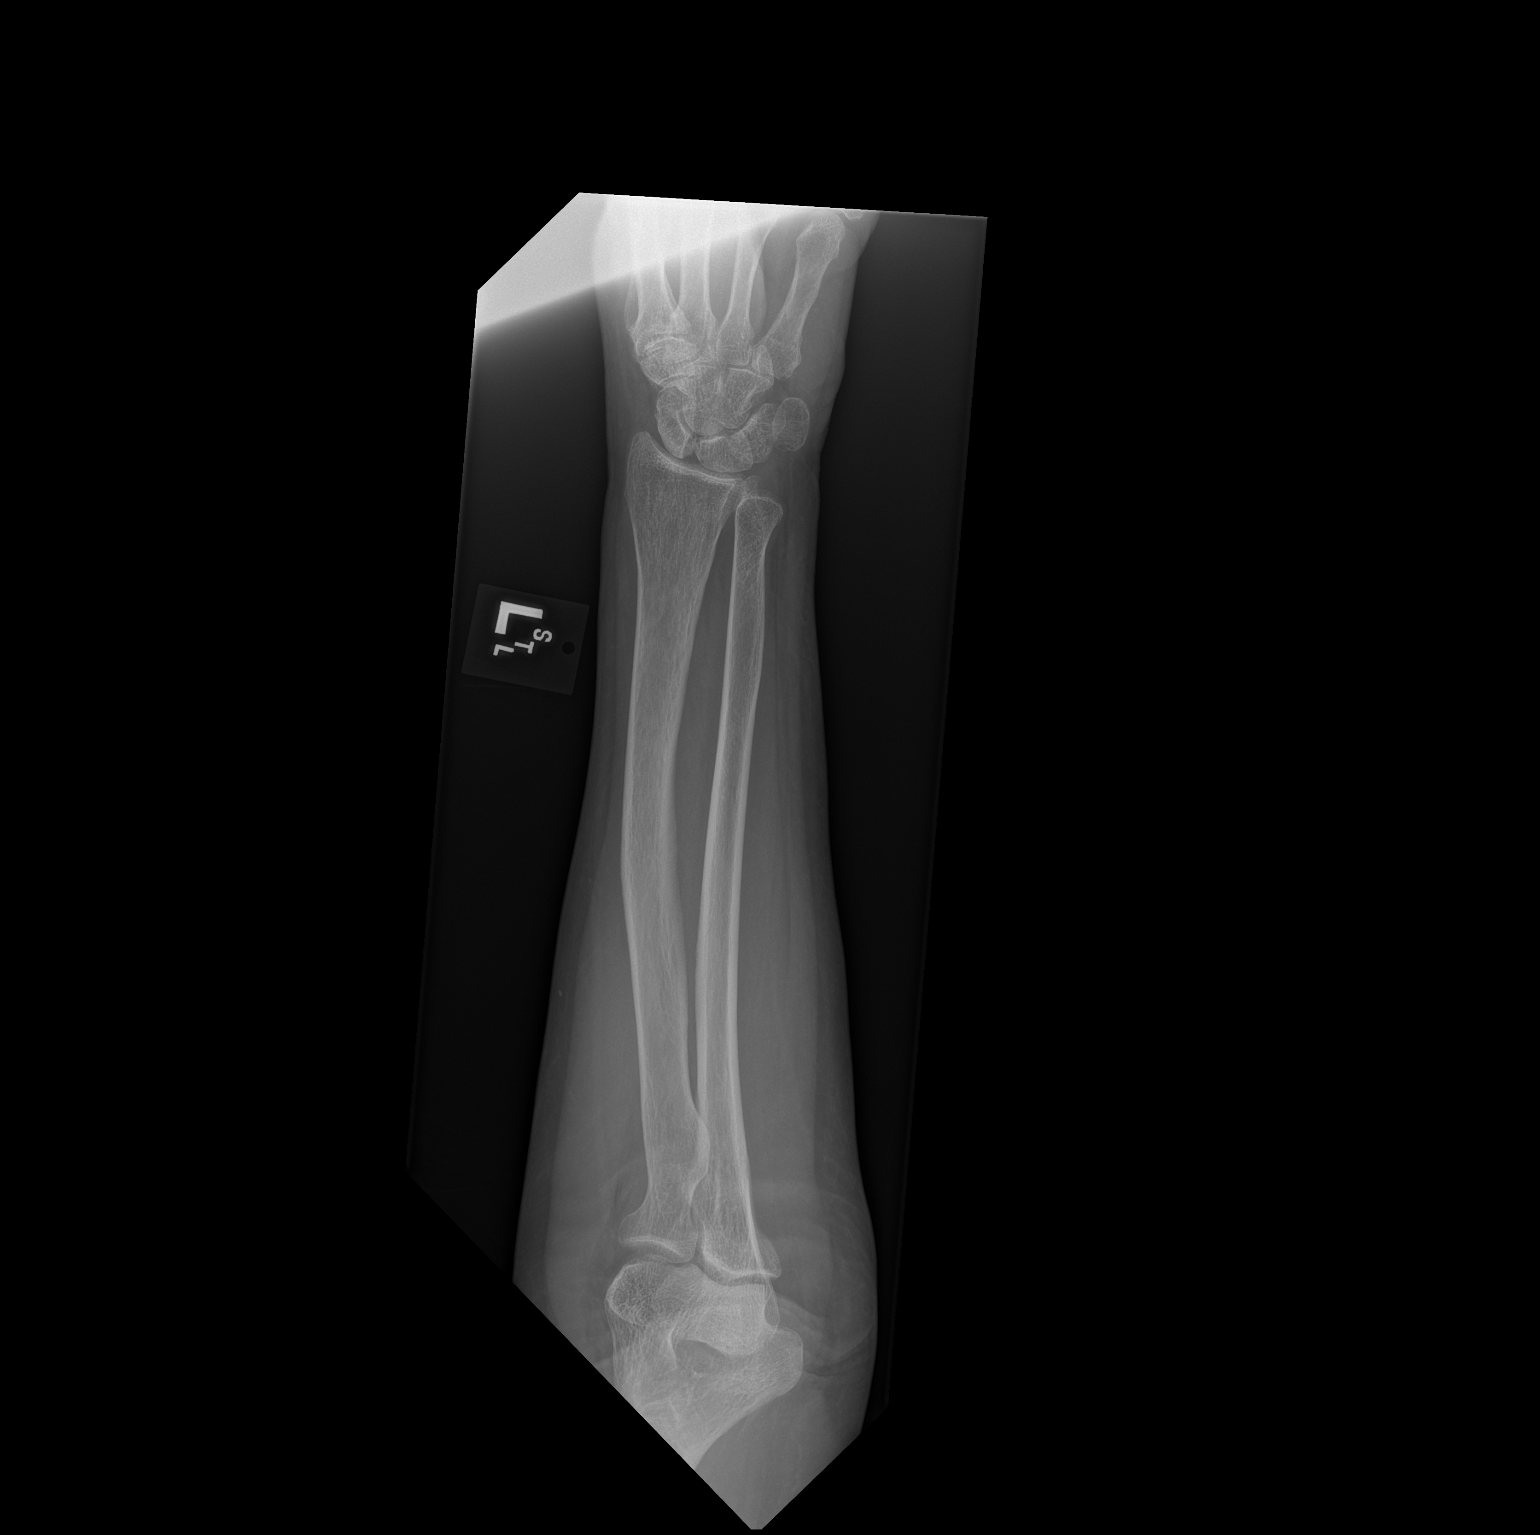

[x forearm lat left (1 of 2)]
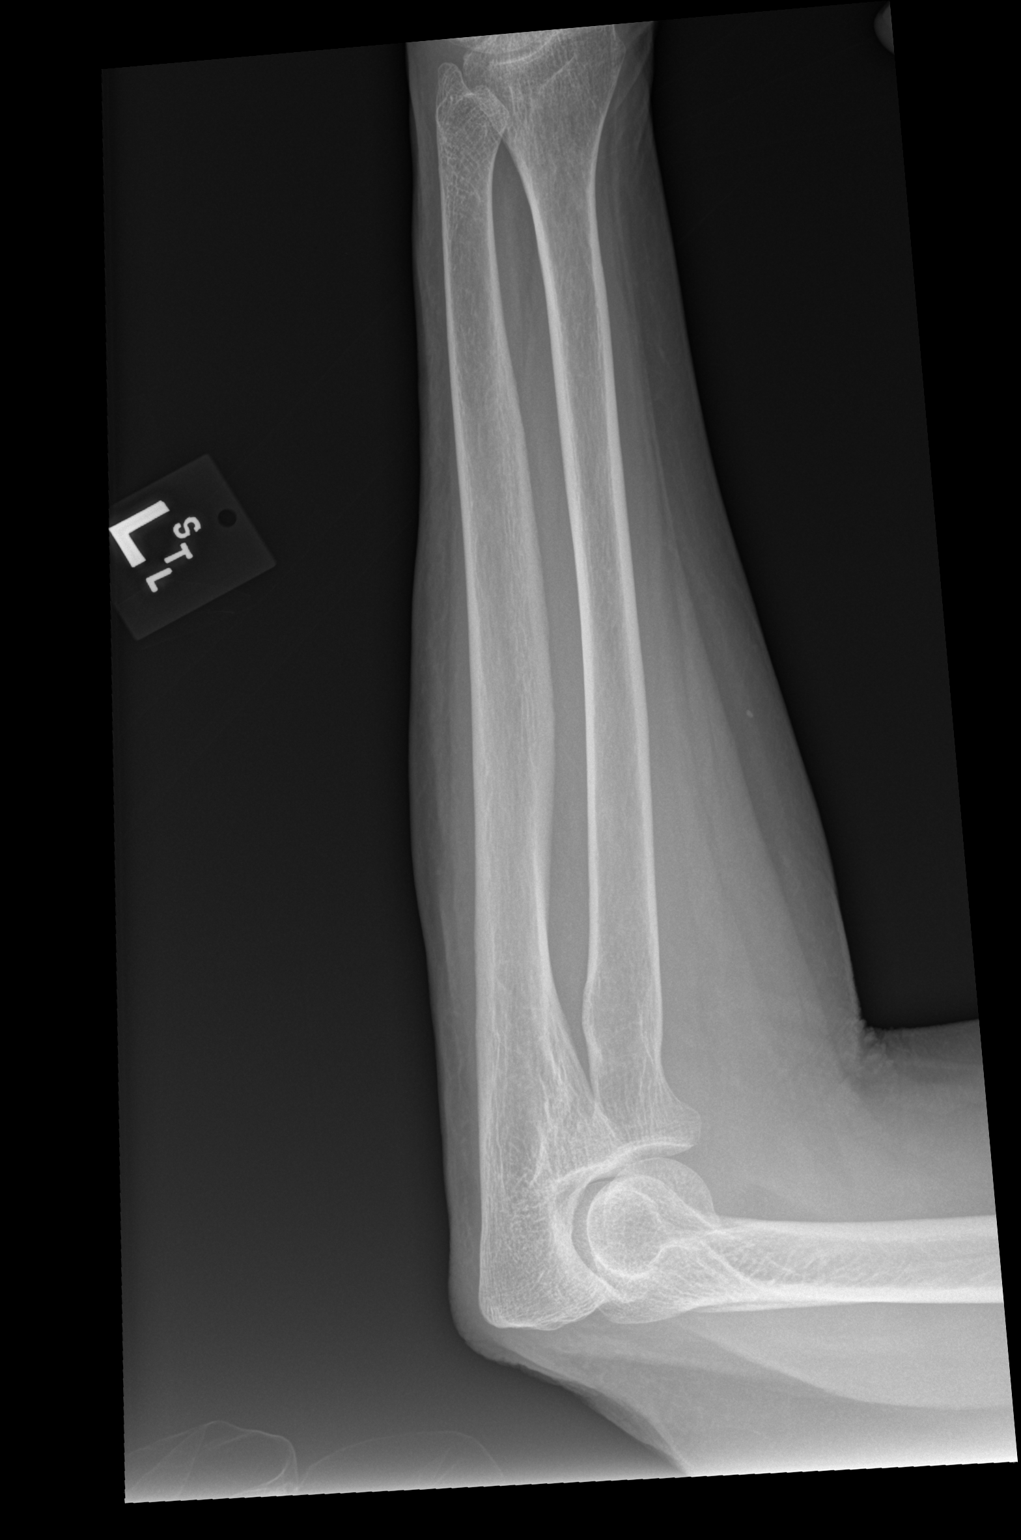

[x forearm lat left (2 of 2)]
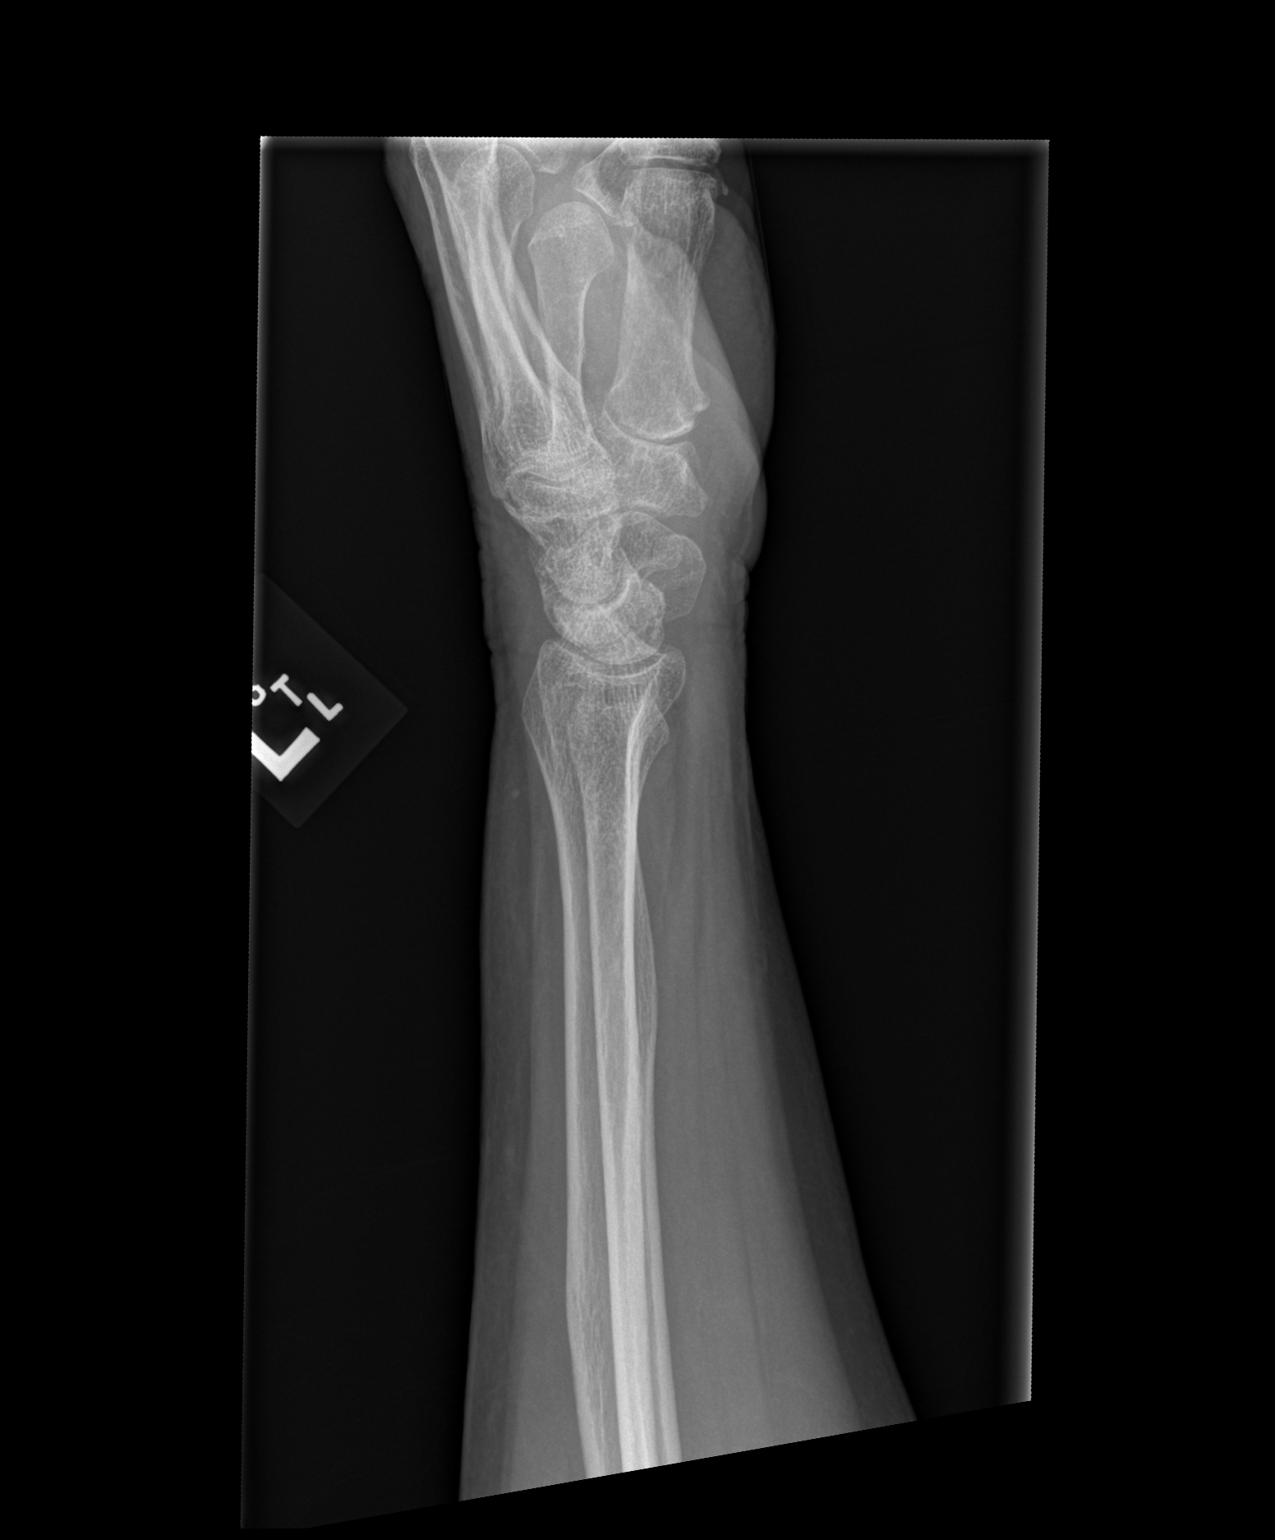

[3 of 3 positions shown; findings below may reference images not displayed]

FINDINGS: No fracture or malalignment. Suspicion of small elbow effusion. Soft
tissues are otherwise unremarkable
IMPRESSION: 1. No fracture is seen
2. Suspect small elbow effusion, correlate for pain to the elbow.

## 2021-10-05 IMAGING — DX DG CHEST 1V PORT
1 series · 1 of 1 positions shown · non-contrast
Comparison: [DATE]

CLINICAL DATA: Shortness of breath

EXAM:
PORTABLE CHEST 1 VIEW

[chest ap]
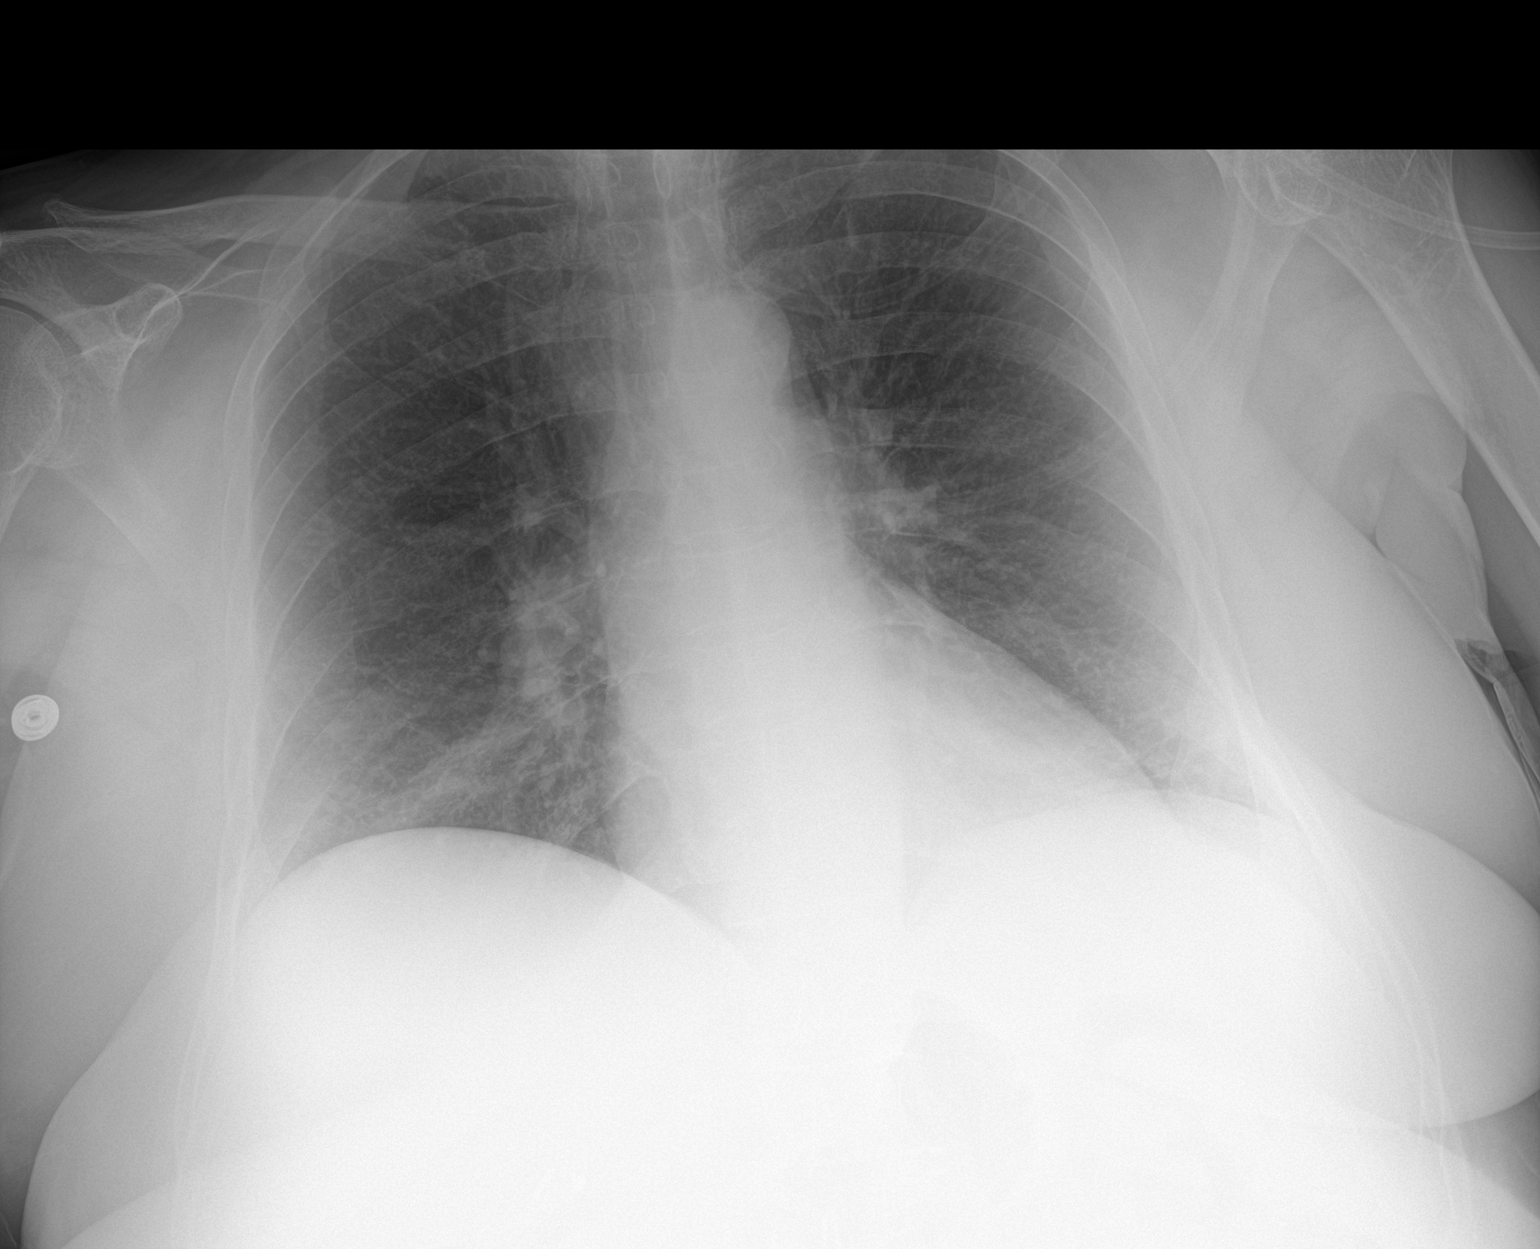

[1 of 1 positions shown; findings below may reference images not displayed]

FINDINGS: The heart size and mediastinal contours are within normal limits.
Both lungs are clear. The visualized skeletal structures are
unremarkable.
IMPRESSION: No active disease.

## 2021-10-05 MED ORDER — HYDROMORPHONE HCL 1 MG/ML IJ SOLN
1.0000 mg | Freq: Once | INTRAMUSCULAR | Status: AC
  Administered 2021-10-05: 1 mg via INTRAMUSCULAR
  Filled 2021-10-05: qty 1

## 2021-10-05 NOTE — ED Provider Notes (Signed)
[facility] DEPT Provider Note   CSN: [id number] Arrival date & time: 10/05/21  1844     History {Add pertinent medical, surgical, social history, OB history to HPI:1} No chief complaint on file.   Renee Pitts is a 61 y.o. female.  Patient fell off her wheelchair on a ramp.   Fall      Home Medications Prior to Admission medications   Medication Sig Start Date End Date Taking? Authorizing Provider  butalbital-acetaminophen-caffeine (FIORICET) 50-325-40 MG tablet Take 1 tablet by mouth every 6 (six) hours as needed for headache or migraine. 08/31/21  Yes Tawnya Crook, MD  dicyclomine (BENTYL) 10 MG capsule Take 1 capsule (10 mg total) by mouth in the morning and at bedtime. 09/18/21  Yes Zehr, Laban Emperor, PA-C  DULoxetine (CYMBALTA) 60 MG capsule Take 1 capsule (60 mg total) by mouth 2 (two) times daily. 08/18/21  Yes Tawnya Crook, MD  erythromycin ophthalmic ointment Place 1 application. into the left eye at bedtime.   Yes [provider]  HYDROmorphone (DILAUDID) 2 MG tablet Take 1 tablet (2 mg total) by mouth 4 (four) times daily as needed (for pain). 09/21/21  Yes Tawnya Crook, MD  hydrOXYzine (ATARAX) 25 MG tablet Take 25 mg by mouth 3 (three) times daily as needed for itching.   Yes [provider]  mupirocin ointment (BACTROBAN) 2 % Place 1 application. into the nose 2 (two) times daily. 09/10/21  Yes Hudnell, Colletta [place], NP  ondansetron (ZOFRAN) 4 MG tablet Take 1 tablet (4 mg total) by mouth every 8 (eight) hours as needed for nausea or vomiting. 08/19/21  Yes Tawnya Crook, MD  pramipexole (MIRAPEX) 1 MG tablet Take 1 tablet (1 mg total) by mouth 2 (two) times daily. 09/10/21  Yes Jeanie Sewer, NP  prednisoLONE acetate (PRED FORTE) 1 % ophthalmic suspension Place 1 drop into the left eye 3 (three) times daily.   Yes [provider]  pregabalin (LYRICA) 150 MG capsule Take 1 capsule (150 mg total) by  mouth 3 (three) times daily. 09/21/21  Yes Tawnya Crook, MD  tiZANidine (ZANAFLEX) 4 MG tablet Take 1 tablet (4 mg total) by mouth every 6 (six) hours as needed for muscle spasms. 09/10/21  Yes Jeanie Sewer, NP      Allergies    Vancomycin, Metoclopramide, Prochlorperazine, Baclofen, Benadryl [diphenhydramine], and Morphine    Review of Systems   Review of Systems  Physical Exam Updated Vital Signs BP 113/87   Pulse 87   Temp 98 F (36.7 C) (Oral)   Resp 17   Ht '5\' 7"'$  (1.702 m)   Wt 81.6 kg   SpO2 96%   BMI 28.19 kg/m  Physical Exam  ED Results / Procedures / Treatments   Labs (all labs ordered are listed, but only abnormal results are displayed) Labs Reviewed - No data to display  EKG None  Radiology DG Forearm Left  Result Date: 10/05/2021 CLINICAL DATA:  [place] out of wheelchair EXAM: LEFT FOREARM - 2 VIEW COMPARISON:  None Available. FINDINGS: No fracture or malalignment. Suspicion of small elbow effusion. Soft tissues are otherwise unremarkable IMPRESSION: 1. No fracture is seen 2. Suspect small elbow effusion, correlate for pain to the elbow. Electronically Signed   By: Donavan Foil M.D.   On: 10/05/2021 22:10   DG Forearm Right  Result Date: 10/05/2021 CLINICAL DATA:  Pain after a fall. EXAM: RIGHT FOREARM - 2 VIEW COMPARISON:  None Available. FINDINGS: There  is an oblique linear lucency along the base of the distal right radial metaphysis suggesting a nondisplaced fracture. Possible fracture of the ulnar styloid process as well. Right wrist views may be useful for further evaluation. The proximal radius and ulna appear intact and there is no dislocation. Soft tissues are unremarkable. IMPRESSION: Probable nondisplaced fractures of the distal right radial metaphysis and ulnar styloid process. Electronically Signed   By: Lucienne Capers M.D.   On: 10/05/2021 22:07   DG Ankle Complete Left  Result Date: 10/05/2021 CLINICAL DATA:  [place], left ankle pain EXAM: LEFT  ANKLE COMPLETE - 3+ VIEW COMPARISON:  None Available. FINDINGS: Frontal, oblique, and lateral views of the left ankle are obtained. No acute displaced fracture, subluxation, or dislocation. Enthesopathic changes versus sequela of prior healed trauma at the superior margin of the calcaneus. Joint spaces are relatively well preserved. Soft tissues are unremarkable. IMPRESSION: 1. No acute bony abnormality. Electronically Signed   By: Randa Ngo M.D.   On: 10/05/2021 22:07   DG Chest Port 1 View  Result Date: 10/05/2021 CLINICAL DATA:  Shortness of breath EXAM: PORTABLE CHEST 1 VIEW COMPARISON:  08/13/2021 FINDINGS: The heart size and mediastinal contours are within normal limits. Both lungs are clear. The visualized skeletal structures are unremarkable. IMPRESSION: No active disease. Electronically Signed   By: Lucienne Capers M.D.   On: 10/05/2021 22:50   DG Knee Complete 4 Views Left  Result Date: 10/05/2021 CLINICAL DATA:  Fall EXAM: LEFT KNEE - COMPLETE 4+ VIEW COMPARISON:  None Available. FINDINGS: No fracture or malalignment. No sizable knee effusion. Mild tricompartment arthritis of the knee IMPRESSION: No acute osseous abnormality. Electronically Signed   By: Donavan Foil M.D.   On: 10/05/2021 22:07   DG Hip Unilat W or Wo Pelvis 2-3 Views Left  Result Date: 10/05/2021 CLINICAL DATA:  History of fall EXAM: DG HIP (WITH OR WITHOUT PELVIS) 2-3V LEFT COMPARISON:  CT 08/13/2021 FINDINGS: Pubic symphysis and rami appear intact. No fracture or malalignment on the left. Chronic postoperative and posttraumatic deformity of the proximal right femur. Chronic fracture of right intramedullary hardware with similar appearance of lucency surrounding the hardware. IMPRESSION: 1. No acute osseous abnormality 2. Chronic deformity of the proximal right femur and stable appearance of the right femoral hardware. Electronically Signed   By: Donavan Foil M.D.   On: 10/05/2021 22:09    Procedures Procedures   {Document cardiac monitor, telemetry assessment procedure when appropriate:1}  Medications Ordered in ED Medications  HYDROmorphone (DILAUDID) injection 1 mg (1 mg Intramuscular Given 10/05/21 2106)    ED Course/ Medical Decision Making/ A&P                           Medical Decision Making Amount and/or Complexity of Data Reviewed Radiology: ordered.  Risk Prescription drug management.   Patient with a nondisplaced distal radial fracture that is closed.  She is put in a splint and will follow-up with her doctor  {Document critical care time when appropriate:1} {Document review of labs and clinical decision tools ie heart score, Chads2Vasc2 etc:1}  {Document your independent review of radiology images, and any outside records:1} {Document your discussion with family members, caretakers, and with consultants:1} {Document social determinants of health affecting pt's care:1} {Document your decision making why or why not admission, treatments were needed:1} Final Clinical Impression(s) / ED Diagnoses Final diagnoses:  Fall, initial encounter    Rx / DC Orders ED Discharge Orders  None       

## 2021-10-05 NOTE — Discharge Instructions (Signed)
Follow-up with either your family doctor or your orthopedic doctor in a week

## 2021-10-05 NOTE — ED Triage Notes (Signed)
Pt arrived via EMS, rolled electric wheelchair going down ramp. No blood thinners, no LOC. Bilateral arm pain.

## 2021-10-05 NOTE — ED Notes (Signed)
Pt desatted to 76%. Placed on O2. Provider made aware.

## 2021-10-06 ENCOUNTER — Ambulatory Visit: Payer: Medicare Other | Admitting: Family Medicine

## 2021-10-06 ENCOUNTER — Telehealth: Payer: Self-pay | Admitting: Family Medicine

## 2021-10-06 NOTE — Telephone Encounter (Signed)
Spoke to [employer] and she stated that patient is still sleeping a lot, but she is waking her up every so often and asking her if she is okay. Patient is responding. Tammy notified that Phyliss need to go to ortho, she stated that she call [facility] so that she can take patient there.

## 2021-10-06 NOTE — Telephone Encounter (Signed)
Patient took a fall with wheelchair going down ramp on 5/22 patient has cx her apt for today 5/23- Tammy (friend) is worried that patient is still asleep as of today 1am - patient was given meds at ed and when she got home- should tammy be worried or let her rest?

## 2021-10-06 NOTE — Telephone Encounter (Signed)
Please advise 

## 2021-10-07 ENCOUNTER — Telehealth: Payer: Self-pay | Admitting: Gastroenterology

## 2021-10-07 NOTE — Telephone Encounter (Signed)
Ok, thank you for the update.

## 2021-10-07 NOTE — Telephone Encounter (Signed)
Patients care giver Tammy called to cancel procedure scheduled for 10/09/21 she said the patient had a really bad fall from her electric chair off the ramp. IS bruise pretty bad and would need some time to heal.

## 2021-10-09 ENCOUNTER — Encounter: Payer: Medicare Other | Admitting: Gastroenterology

## 2021-10-13 ENCOUNTER — Ambulatory Visit: Payer: Medicare Other | Admitting: Family Medicine

## 2021-10-25 ENCOUNTER — Encounter: Payer: Self-pay | Admitting: Family Medicine

## 2021-10-30 ENCOUNTER — Encounter: Payer: Self-pay | Admitting: Family Medicine

## 2021-10-30 MED ORDER — TRAZODONE HCL 50 MG PO TABS: 25.0000 mg | ORAL_TABLET | Freq: Every evening | ORAL | 1 refills | Status: AC | PRN

## 2021-10-30 MED ORDER — HYDROMORPHONE HCL 2 MG PO TABS: 2.0000 mg | ORAL_TABLET | Freq: Four times a day (QID) | ORAL | 0 refills | Status: AC | PRN

## 2021-10-30 NOTE — Addendum Note (Signed)
Addended by: Wellington Hampshire on: 10/30/2021 12:54 PM   Modules accepted: Orders

## 2021-11-07 ENCOUNTER — Other Ambulatory Visit: Payer: Self-pay | Admitting: Family Medicine

## 2021-11-09 ENCOUNTER — Telehealth: Payer: Self-pay | Admitting: Family Medicine

## 2021-11-24 ENCOUNTER — Other Ambulatory Visit: Payer: Self-pay | Admitting: *Deleted

## 2021-11-24 ENCOUNTER — Telehealth: Payer: Self-pay | Admitting: Family Medicine

## 2021-11-24 DIAGNOSIS — G894 Chronic pain syndrome: Secondary | ICD-10-CM

## 2021-11-24 NOTE — Telephone Encounter (Signed)
Patient requests a Referral be sent to [facility] in [place].

## 2021-11-24 NOTE — Telephone Encounter (Signed)
Referral placed as requested.

## 2021-12-03 ENCOUNTER — Other Ambulatory Visit: Payer: Self-pay | Admitting: Family

## 2021-12-14 ENCOUNTER — Ambulatory Visit (INDEPENDENT_AMBULATORY_CARE_PROVIDER_SITE_OTHER): Payer: Medicare Other

## 2021-12-14 DIAGNOSIS — Z0000 Encounter for general adult medical examination without abnormal findings: Secondary | ICD-10-CM | POA: Diagnosis not present

## 2021-12-14 NOTE — Patient Instructions (Addendum)
[name] , Thank you for taking time to come for your Medicare Wellness Visit. I appreciate your ongoing commitment to your health goals. Please review the following plan we discussed and let me know if I can assist you in the future.   Screening recommendations/referrals: Colonoscopy: postponed per pt Mammogram: postponed per pt  Recommended yearly ophthalmology/optometry visit for glaucoma screening and checkup Recommended yearly dental visit for hygiene and checkup  Vaccinations: Influenza vaccine: done 02/03/21 repeat every year  Pneumococcal vaccine: due Tdap vaccine: due Shingles vaccine: Shingrix discussed. Please contact your pharmacy for coverage information.   Covid-19: completed 3/29, 09/10/19  Advanced directives: Please bring a copy of your health care power of attorney and living will to the office at your convenience.  Conditions/risks identified: none at this time   Next appointment: Follow up in one year for your annual wellness visit.   Preventive Care 40-64 Years, Female Preventive care refers to lifestyle choices and visits with your health care provider that can promote health and wellness. What does preventive care include? A yearly physical exam. This is also called an annual well check. Dental exams once or twice a year. Routine eye exams. Ask your health care provider how often you should have your eyes checked. Personal lifestyle choices, including: Daily care of your teeth and gums. Regular physical activity. Eating a healthy diet. Avoiding tobacco and drug use. Limiting alcohol use. Practicing safe sex. Taking low-dose aspirin daily starting at age 37. Taking vitamin and mineral supplements as recommended by your health care provider. What happens during an annual well check? The services and screenings done by your health care provider during your annual well check will depend on your age, overall health, lifestyle risk factors, and family history  of disease. Counseling  Your health care provider may ask you questions about your: Alcohol use. Tobacco use. Drug use. Emotional well-being. Home and relationship well-being. Sexual activity. Eating habits. Work and work Statistician. Method of birth control. Menstrual cycle. Pregnancy history. Screening  You may have the following tests or measurements: Height, weight, and BMI. Blood pressure. Lipid and cholesterol levels. These may be checked every 5 years, or more frequently if you are over 51 years old. Skin check. Lung cancer screening. You may have this screening every year starting at age 17 if you have a 30-pack-year history of smoking and currently smoke or have quit within the past 15 years. Fecal occult blood test (FOBT) of the stool. You may have this test every year starting at age 108. Flexible sigmoidoscopy or colonoscopy. You may have a sigmoidoscopy every 5 years or a colonoscopy every 10 years starting at age 29. Hepatitis C blood test. Hepatitis B blood test. Sexually transmitted disease (STD) testing. Diabetes screening. This is done by checking your blood sugar (glucose) after you have not eaten for a while (fasting). You may have this done every 1-3 years. Mammogram. This may be done every 1-2 years. Talk to your health care provider about when you should start having regular mammograms. This may depend on whether you have a family history of breast cancer. BRCA-related cancer screening. This may be done if you have a family history of breast, ovarian, tubal, or peritoneal cancers. Pelvic exam and Pap test. This may be done every 3 years starting at age 21. Starting at age 73, this may be done every 5 years if you have a Pap test in combination with an HPV test. Bone density scan. This is done to screen for  osteoporosis. You may have this scan if you are at high risk for osteoporosis. Discuss your test results, treatment options, and if necessary, the need for more  tests with your health care provider. Vaccines  Your health care provider may recommend certain vaccines, such as: Influenza vaccine. This is recommended every year. Tetanus, diphtheria, and acellular pertussis (Tdap, Td) vaccine. You may need a Td booster every 10 years. Zoster vaccine. You may need this after age 28. Pneumococcal 13-valent conjugate (PCV13) vaccine. You may need this if you have certain conditions and were not previously vaccinated. Pneumococcal polysaccharide (PPSV23) vaccine. You may need one or two doses if you smoke cigarettes or if you have certain conditions. Talk to your health care provider about which screenings and vaccines you need and how often you need them. This information is not intended to replace advice given to you by your health care provider. Make sure you discuss any questions you have with your health care provider. Document Released: 05/30/2015 Document Revised: 01/21/2016 Document Reviewed: 03/04/2015 Elsevier Interactive Patient Education  2017 [place] Prevention in the Home Falls can cause injuries. They can happen to people of all ages. There are many things you can do to make your home safe and to help prevent falls. What can I do on the outside of my home? Regularly fix the edges of walkways and driveways and fix any cracks. Remove anything that might make you trip as you walk through a door, such as a raised step or threshold. Trim any bushes or trees on the path to your home. Use bright outdoor lighting. Clear any walking paths of anything that might make someone trip, such as rocks or tools. Regularly check to see if handrails are loose or broken. Make sure that both sides of any steps have handrails. Any raised decks and porches should have guardrails on the edges. Have any leaves, snow, or ice cleared regularly. Use sand or salt on walking paths during winter. Clean up any spills in your garage right away. This includes  oil or grease spills. What can I do in the bathroom? Use night lights. Install grab bars by the toilet and in the tub and shower. Do not use towel bars as grab bars. Use non-skid mats or decals in the tub or shower. If you need to sit down in the shower, use a plastic, non-slip stool. Keep the floor dry. Clean up any water that spills on the floor as soon as it happens. Remove soap buildup in the tub or shower regularly. Attach bath mats securely with double-sided non-slip rug tape. Do not have throw rugs and other things on the floor that can make you trip. What can I do in the bedroom? Use night lights. Make sure that you have a light by your bed that is easy to reach. Do not use any sheets or blankets that are too big for your bed. They should not hang down onto the floor. Have a firm chair that has side arms. You can use this for support while you get dressed. Do not have throw rugs and other things on the floor that can make you trip. What can I do in the kitchen? Clean up any spills right away. Avoid walking on wet floors. Keep items that you use a lot in easy-to-reach places. If you need to reach something above you, use a strong step stool that has a grab bar. Keep electrical cords out of the way. Do not  use floor polish or wax that makes floors slippery. If you must use wax, use non-skid floor wax. Do not have throw rugs and other things on the floor that can make you trip. What can I do with my stairs? Do not leave any items on the stairs. Make sure that there are handrails on both sides of the stairs and use them. Fix handrails that are broken or loose. Make sure that handrails are as long as the stairways. Check any carpeting to make sure that it is firmly attached to the stairs. Fix any carpet that is loose or worn. Avoid having throw rugs at the top or bottom of the stairs. If you do have throw rugs, attach them to the floor with carpet tape. Make sure that you have a light  switch at the top of the stairs and the bottom of the stairs. If you do not have them, ask someone to add them for you. What else can I do to help prevent falls? Wear shoes that: Do not have high heels. Have rubber bottoms. Are comfortable and fit you well. Are closed at the toe. Do not wear sandals. If you use a stepladder: Make sure that it is fully opened. Do not climb a closed stepladder. Make sure that both sides of the stepladder are locked into place. Ask someone to hold it for you, if possible. Clearly mark and make sure that you can see: Any grab bars or handrails. First and last steps. Where the edge of each step is. Use tools that help you move around (mobility aids) if they are needed. These include: Canes. Walkers. Scooters. Crutches. Turn on the lights when you go into a dark area. Replace any light bulbs as soon as they burn out. Set up your furniture so you have a clear path. Avoid moving your furniture around. If any of your floors are uneven, fix them. If there are any pets around you, be aware of where they are. Review your medicines with your doctor. Some medicines can make you feel dizzy. This can increase your chance of falling. Ask your doctor what other things that you can do to help prevent falls. This information is not intended to replace advice given to you by your health care provider. Make sure you discuss any questions you have with your health care provider. Document Released: 02/27/2009 Document Revised: 10/09/2015 Document Reviewed: 06/07/2014 Elsevier Interactive Patient Education  2017 [employer].

## 2021-12-14 NOTE — Progress Notes (Signed)
Virtual Visit via Telephone Note  I connected with  Renee Pitts on 12/14/21 at 10:00 AM EDT by telephone and verified that I am speaking with the correct person using two identifiers.  Medicare Annual Wellness visit completed telephonically due to Covid-19 pandemic.   Persons participating in this call: This Health Coach and this patient.   Location: Patient: home Provider: office    I discussed the limitations, risks, security and privacy concerns of performing an evaluation and management service by telephone and the availability of in person appointments. The patient expressed understanding and agreed to proceed.  Unable to perform video visit due to video visit attempted and failed and/or patient does not have video capability.   Some vital signs may be absent or patient reported.   Willette Brace, LPN   Subjective:   Renee Pitts is a 61 y.o. female who presents for an Initial Medicare Annual Wellness Visit.  Review of Systems     Cardiac Risk Factors include: sedentary lifestyle     Objective:    There were no vitals filed for this visit. There is no height or weight on file to calculate BMI.     12/14/2021   10:10 AM 08/13/2021    6:26 PM 07/27/2021    1:22 PM  Advanced Directives  Does Patient Have a Medical Advance Directive? Yes No No  Type of Paramedic of [place];Living will    Copy of [place] in Chart? No - copy requested    Would patient like information on creating a medical advance directive?  No - Patient declined     Current Medications (verified) Outpatient Encounter Medications as of 12/14/2021  Medication Sig   butalbital-acetaminophen-caffeine (FIORICET) 50-325-40 MG tablet Take 1 tablet by mouth every 6 (six) hours as needed for headache or migraine.   dicyclomine (BENTYL) 10 MG capsule Take 1 capsule (10 mg total) by mouth in the morning and at bedtime.   DULoxetine (CYMBALTA) 60 MG capsule  Take 1 capsule by mouth twice daily   HYDROmorphone (DILAUDID) 2 MG tablet Take 1 tablet (2 mg total) by mouth 4 (four) times daily as needed (for pain).   hydrOXYzine (ATARAX) 25 MG tablet Take 25 mg by mouth 3 (three) times daily as needed for itching.   mupirocin ointment (BACTROBAN) 2 % Place 1 application. into the nose 2 (two) times daily.   ondansetron (ZOFRAN) 4 MG tablet Take 1 tablet (4 mg total) by mouth every 8 (eight) hours as needed for nausea or vomiting.   pramipexole (MIRAPEX) 1 MG tablet Take 1 tablet by mouth twice daily   pregabalin (LYRICA) 150 MG capsule Take 1 capsule (150 mg total) by mouth 3 (three) times daily.   tiZANidine (ZANAFLEX) 4 MG tablet Take 1 tablet (4 mg total) by mouth every 6 (six) hours as needed for muscle spasms.   traZODone (DESYREL) 50 MG tablet Take 0.5-1 tablets (25-50 mg total) by mouth at bedtime as needed for sleep.   [DISCONTINUED] erythromycin ophthalmic ointment Place 1 application. into the left eye at bedtime.   [DISCONTINUED] prednisoLONE acetate (PRED FORTE) 1 % ophthalmic suspension Place 1 drop into the left eye 3 (three) times daily.   No facility-administered encounter medications on file as of 12/14/2021.    Allergies (verified) Vancomycin, Metoclopramide, Prochlorperazine, Baclofen, Benadryl [diphenhydramine], and Morphine   History: Past Medical History:  Diagnosis Date   Allergy    Anxiety    Arthritis  Blood transfusion without reported diagnosis    Cancer ([place])    Skin   COPD (chronic obstructive pulmonary disease) (HCC)    Depression    GERD (gastroesophageal reflux disease)    Sleep apnea    Tuberculosis    Skin test posituve only   Past Surgical History:  Procedure Laterality Date   ABDOMINAL HYSTERECTOMY     endometriosis   APPENDECTOMY     BREAST SURGERY     CHOLECYSTECTOMY     COLON SURGERY     "twisted colon"   COLONOSCOPY     FRACTURE SURGERY     JOINT REPLACEMENT Right    mult R knee   LEG  AMPUTATION THROUGH FEMUR Right    Family History  Problem Relation Age of Onset   Early death Mother    COPD Sister    Early death Sister    Heart disease Sister    Early death Brother    Heart disease Brother    Liver cancer Paternal Uncle    Pancreatic cancer Paternal Uncle    Liver cancer Maternal Grandfather    Pancreatic cancer Maternal Grandfather    Colon cancer Neg Hx    Esophageal cancer Neg Hx    Colon polyps Neg Hx    Stomach cancer Neg Hx    Social History   Socioeconomic History   Marital status: Single    Spouse name: Not on file   Number of children: Not on file   Years of education: Not on file   Highest education level: Not on file  Occupational History   Not on file  Tobacco Use   Smoking status: Every Day    Packs/day: 1.00    Years: 15.00    Total pack years: 15.00    Types: Cigarettes   Smokeless tobacco: Never  Vaping Use   Vaping Use: Never used  Substance and Sexual Activity   Alcohol use: Never   Drug use: Never   Sexual activity: Not Currently    Birth control/protection: None  Other Topics Concern   Not on file  Social History Narrative   Not on file   Social Determinants of Health   Financial Resource Strain: Low Risk  (12/14/2021)   Overall Financial Resource Strain (CARDIA)    Difficulty of Paying Living Expenses: Not hard at all  Food Insecurity: No Food Insecurity (12/14/2021)   Hunger Vital Sign    Worried About Running Out of Food in the Last Year: Never true    Ran Out of Food in the Last Year: Never true  Transportation Needs: No Transportation Needs (12/14/2021)   PRAPARE - Hydrologist (Medical): No    Lack of Transportation (Non-Medical): No  Physical Activity: Inactive (12/14/2021)   Exercise Vital Sign    Days of Exercise per Week: 0 days    Minutes of Exercise per Session: 0 min  Stress: No Stress Concern Present (12/14/2021)   [place]    Feeling of Stress : Not at all  Social Connections: Socially Isolated (12/14/2021)   Social Connection and Isolation Panel [NHANES]    Frequency of Communication with Friends and Family: Three times a week    Frequency of Social Gatherings with Friends and Family: Once a week    Attends Religious Services: Never    Marine scientist or Organizations: No    Attends Archivist Meetings: Never  Marital Status: Never married    Tobacco Counseling Ready to quit: Not Answered Counseling given: Not Answered   Clinical Intake:  Pre-visit preparation completed: Yes  Pain : No/denies pain     BMI - recorded: 28.19 Nutritional Status: BMI 25 -29 Overweight Nutritional Risks: None Diabetes: No  How often do you need to have someone help you when you read instructions, pamphlets, or other written materials from your doctor or pharmacy?: 1 - Never  Diabetic?no  Interpreter Needed?: No  Information entered by :: Charlott Rakes, LPN   Activities of Daily Living    12/14/2021   10:11 AM  In your present state of health, do you have any difficulty performing the following activities:  Hearing? 0  Vision? 0  Difficulty concentrating or making decisions? 0  Walking or climbing stairs? 0  Comment no stairs ampute  Dressing or bathing? 0  Doing errands, shopping? 0  Preparing Food and eating ? N  Using the Toilet? N  In the past six months, have you accidently leaked urine? N  Do you have problems with loss of bowel control? N  Managing your Medications? N  Managing your Finances? N  Housekeeping or managing your Housekeeping? N    Patient Care Team: Tawnya Crook, MD as PCP - General (Family Medicine)  Indicate any recent Medical Services you may have received from other than [facility] providers in the past year (date may be approximate).     Assessment:   This is a routine wellness examination for Renee Pitts.  Hearing/Vision  screen Hearing Screening - Comments:: Pt denies any hearing issues  Vision Screening - Comments:: Pt follows up with [facility] for eye exams   Dietary issues and exercise activities discussed: Current Exercise Habits: The patient does not participate in regular exercise at present   Goals Addressed             This Visit's Progress    Patient Stated       None at this time        Depression Screen    12/14/2021   10:09 AM 08/18/2021   11:50 AM  PHQ 2/9 Scores  PHQ - 2 Score 0 2  PHQ- 9 Score  16    Fall Risk    12/14/2021   10:11 AM 08/18/2021   11:10 AM  Fall Risk   Falls in the past year? 1 1  Number falls in past yr: 1 1  Injury with Fall? 0 1  Risk for fall due to : Impaired vision;Impaired mobility History of fall(s);Impaired mobility  Follow up Falls prevention discussed Falls evaluation completed;Education provided;Falls prevention discussed    FALL RISK PREVENTION PERTAINING TO THE HOME:  Any stairs in or around the home? No  If so, are there any without handrails? No  Home free of loose throw rugs in walkways, pet beds, electrical cords, etc? Yes  Adequate lighting in your home to reduce risk of falls? Yes   ASSISTIVE DEVICES UTILIZED TO PREVENT FALLS:  Life alert?  yes Use of a cane, walker or w/c? No  Grab bars in the bathroom? Yes  Shower chair or bench in shower? Yes  Elevated toilet seat or a handicapped toilet? Yes   TIMED UP AND GO:  Was the test performed? No .   Cognitive Function:        12/14/2021   10:13 AM  6CIT Screen  What Year? 0 points  What month? 0 points  What time? 0 points  Count back from 20 0 points  Months in reverse 0 points  Repeat phrase 0 points  Total Score 0 points    Immunizations Immunization History  Administered Date(s) Administered   Influenza, Seasonal, Injecte, Preservative Fre 03/09/2013, 03/09/2013, 03/15/2013, 03/15/2013, 03/21/2014, 03/21/2014   Influenza,inj,Quad PF,6+ Mos 03/15/2013,  02/03/2021   Influenza,inj,quad, With Preservative 03/15/2013, 03/02/2018, 03/18/2020   Influenza-Unspecified 03/15/2013, 03/15/2013, 03/02/2018, 03/02/2018, 03/18/2020, 03/18/2020   Moderna Sars-Covid-2 Vaccination 08/13/2019, 09/10/2019   PPD Test 11/30/2018, 11/30/2018   Pneumococcal Polysaccharide-23 03/02/2018, 03/02/2018, 02/03/2021    TDAP status: Due, Education has been provided regarding the importance of this vaccine. Advised may receive this vaccine at local pharmacy or Health Dept. Aware to provide a copy of the vaccination record if obtained from local pharmacy or Health Dept. Verbalized acceptance and understanding.  Flu Vaccine status: Up to date  Pneumococcal vaccine status: Due, Education has been provided regarding the importance of this vaccine. Advised may receive this vaccine at local pharmacy or Health Dept. Aware to provide a copy of the vaccination record if obtained from local pharmacy or Health Dept. Verbalized acceptance and understanding.  Covid-19 vaccine status: Completed vaccines  Qualifies for Shingles Vaccine? Yes   Zostavax completed No   Shingrix Completed?: No.    Education has been provided regarding the importance of this vaccine. Patient has been advised to call insurance company to determine out of pocket expense if they have not yet received this vaccine. Advised may also receive vaccine at local pharmacy or Health Dept. Verbalized acceptance and understanding.  Screening Tests Health Maintenance  Topic Date Due   HIV Screening  Never done   Hepatitis C Screening  Never done   TETANUS/TDAP  Never done   Zoster Vaccines- Shingrix (1 of 2) Never done   PAP SMEAR-Modifier  Never done   COVID-19 Vaccine (3 - Moderna risk series) 12/29/2021 (Originally 10/08/2019)   MAMMOGRAM  12/15/2022 (Originally 06/23/2011)   COLONOSCOPY (Pts 45-16yr Insurance coverage will need to be confirmed)  12/15/2022 (Originally 06/22/2006)   INFLUENZA VACCINE  12/15/2021    HPV VACCINES  Aged Out    Health Maintenance  Health Maintenance Due  Topic Date Due   HIV Screening  Never done   Hepatitis C Screening  Never done   TETANUS/TDAP  Never done   Zoster Vaccines- Shingrix (1 of 2) Never done   PAP SMEAR-Modifier  Never done    Colonoscopy postponed   Mammogram postponed     Additional Screening:   Vision Screening: Recommended annual ophthalmology exams for early detection of glaucoma and other disorders of the eye. Is the patient up to date with their annual eye exam?  Yes  Who is the provider or what is the name of the office in which the patient attends annual eye exams? [facility]  If pt is not established with a provider, would they like to be referred to a provider to establish care? No .   Dental Screening: Recommended annual dental exams for proper oral hygiene  Community Resource Referral / Chronic Care Management: CRR required this visit?  No   CCM required this visit?  No      Plan:     I have personally reviewed and noted the following in the patient's chart:   Medical and social history Use of alcohol, tobacco or illicit drugs  Current medications and supplements including opioid prescriptions. Patient is currently taking opioid prescriptions. Information provided to patient regarding non-opioid alternatives. Patient advised to discuss non-opioid treatment plan  with their provider. Functional ability and status Nutritional status Physical activity Advanced directives List of other physicians Hospitalizations, surgeries, and ER visits in previous 12 months Vitals Screenings to include cognitive, depression, and falls Referrals and appointments  In addition, I have reviewed and discussed with patient certain preventive protocols, quality metrics, and best practice recommendations. A written personalized care plan for preventive services as well as general preventive health recommendations were provided to patient.      Willette Brace, LPN   [id number]   Nurse Notes: none

## 2021-12-15 ENCOUNTER — Other Ambulatory Visit: Payer: Self-pay | Admitting: Family

## 2021-12-30 NOTE — Progress Notes (Deleted)
Initial neurology clinic note  SERVICE DATE: *** SERVICE TIME: ***  Reason for Evaluation: Consultation requested by Tawnya Crook, MD for an opinion regarding headaches, pain in arms and legs***. My final recommendations will be communicated back to the requesting physician by way of shared medical record or letter to requesting physician via [place] mail.  HPI: This is Ms. Leslie Andrea, a 61 y.o. ***-handed female with a medical history of chronic pain, AKA c/b phantom limb pain, COPD, GERD, OA, anxiety*** who presents to neurology clinic with the chief complaint above. The patient is accompanied by ***.  ***  The patient has not*** had similar episodes of symptoms in the past. *** Muscle bulk loss? *** Muscle pain? *** Cramps/Twitching? *** Suggestion of myotonia/difficulty relaxing after contraction? *** Fatigable weakness?*** Does strength improve after brief exercise?*** Able to brush hair/teeth without difficulty? *** Able to button shirts/use zips? *** Clumsiness/dropping grasped objects?*** Can you arise from squatted position easily? *** Able to get out of chair without using arms? *** Able to walk up steps easily? *** Use an assistive device to walk? *** Significant imbalance with walking? *** Falls?*** Any change in urine color, especially after exertion/physical activity? ***  The patient denies*** symptoms suggestive of oculobulbar weakness including diplopia, ptosis, dysphagia, poor saliva control, dysarthria/dysphonia, impaired mastication, facial weakness/droop.  There are no*** neuromuscular respiratory weakness symptoms, particularly orthopnea>dyspnea. Pseudobulbar affect is absent***. The patient does not*** report symptoms referable to autonomic dysfunction including impaired sweating, heat or cold intolerance, excessive mucosal dryness, gastroparetic early satiety, postprandial abdominal bloating, constipation, bowel or bladder dyscontrol, erectile dysfunction*** or  syncope/presyncope/orthostatic intolerance.  There are no*** complaints relating to other symptoms of small fiber modalities including paresthesia/pain.  The patient has not *** noticed any recent skin rashes nor does he*** report any constitutional symptoms like fever, night sweats, anorexia or unintentional weight loss.  EtOH use: ***  Restrictive diet? *** Family history of neuropathy/myopathy/NM disease?***  Previous labs, electrodiagnostics, and neuroimaging are summarized below, but pertinent findings include***  Any biopsy done? *** Current medications being tried for the patient's symptoms include ***  Prior medications that have been tried: ***Fioricet, cymbalta '60mg'$  daily, dilaudid, lryica   MEDICATIONS:  Outpatient Encounter Medications as of 01/01/2022  Medication Sig Note   butalbital-acetaminophen-caffeine (FIORICET) 50-325-40 MG tablet Take 1 tablet by mouth every 6 (six) hours as needed for headache or migraine.    dicyclomine (BENTYL) 10 MG capsule Take 1 capsule (10 mg total) by mouth in the morning and at bedtime.    DULoxetine (CYMBALTA) 60 MG capsule Take 1 capsule by mouth twice daily    HYDROmorphone (DILAUDID) 2 MG tablet Take 1 tablet (2 mg total) by mouth 4 (four) times daily as needed (for pain).    hydrOXYzine (ATARAX) 25 MG tablet Take 25 mg by mouth 3 (three) times daily as needed for itching.    mupirocin ointment (BACTROBAN) 2 % Place 1 application. into the nose 2 (two) times daily.    ondansetron (ZOFRAN) 4 MG tablet Take 1 tablet (4 mg total) by mouth every 8 (eight) hours as needed for nausea or vomiting.    pramipexole (MIRAPEX) 1 MG tablet Take 1 tablet by mouth twice daily    pregabalin (LYRICA) 150 MG capsule Take 1 capsule (150 mg total) by mouth 3 (three) times daily. 10/05/2021: Second dose    tiZANidine (ZANAFLEX) 4 MG tablet Take 1 tablet (4 mg total) by mouth every 6 (six) hours as needed for muscle spasms.  traZODone (DESYREL) 50 MG tablet  Take 0.5-1 tablets (25-50 mg total) by mouth at bedtime as needed for sleep.    No facility-administered encounter medications on file as of 01/01/2022.    PAST MEDICAL HISTORY: Past Medical History:  Diagnosis Date   Allergy    Anxiety    Arthritis    Blood transfusion without reported diagnosis    Cancer ([facility])    Skin   COPD (chronic obstructive pulmonary disease) (HCC)    Depression    GERD (gastroesophageal reflux disease)    Sleep apnea    Tuberculosis    Skin test posituve only    PAST SURGICAL HISTORY: Past Surgical History:  Procedure Laterality Date   ABDOMINAL HYSTERECTOMY     endometriosis   APPENDECTOMY     BREAST SURGERY     CHOLECYSTECTOMY     COLON SURGERY     "twisted colon"   COLONOSCOPY     FRACTURE SURGERY     JOINT REPLACEMENT Right    mult R knee   LEG AMPUTATION THROUGH FEMUR Right     ALLERGIES: Allergies  Allergen Reactions   Vancomycin Anaphylaxis and Shortness Of Breath   Metoclopramide Other (See Comments)    Reaction not recalled   Prochlorperazine Other (See Comments)    Agitation, Mental Status Changes, and increased RLS symptoms   Baclofen Hypertension   Benadryl [Diphenhydramine] Other (See Comments)    "Hypes me up and causes my legs to go into motion very badly"   Morphine Hives and Other (See Comments)    Ineffective, also    FAMILY HISTORY: Family History  Problem Relation Age of Onset   Early death Mother    COPD Sister    Early death Sister    Heart disease Sister    Early death Brother    Heart disease Brother    Liver cancer Paternal Uncle    Pancreatic cancer Paternal Uncle    Liver cancer Maternal Grandfather    Pancreatic cancer Maternal Grandfather    Colon cancer Neg Hx    Esophageal cancer Neg Hx    Colon polyps Neg Hx    Stomach cancer Neg Hx     SOCIAL HISTORY: Social History   Tobacco Use   Smoking status: Every Day    Packs/day: 1.00    Years: 15.00    Total pack years: 15.00    Types:  Cigarettes   Smokeless tobacco: Never  Vaping Use   Vaping Use: Never used  Substance Use Topics   Alcohol use: Never   Drug use: Never   Social History   Social History Narrative   Not on file     OBJECTIVE: REVIEW OF SYSTEMS: ***Negative except as noted in HPI  PHYSICAL EXAM: There were no vitals taken for this visit.  General:*** General appearance: Awake and alert. No distress. Cooperative with exam.  Skin: No obvious rash or jaundice. HEENT: Atraumatic. Anicteric. Lungs: Non-labored breathing on room air  Heart: Regular Abdomen: Soft, non tender. Extremities: No edema. No obvious deformity.  Musculoskeletal: No obvious joint swelling. Psych: Affect appropriate.  Neurological: Mental Status: Alert. Speech fluent. No pseudobulbar affect Cranial Nerves: CNII: No RAPD. Visual fields intact. CNIII, IV, VI: PERRL. No nystagmus. EOMI. CN V: Facial sensation intact bilaterally to fine touch. [place] clench strong. Jaw jerk***. CN VII: Facial muscles symmetric and strong. No ptosis at rest or after sustained upgaze***. CN VIII: Hears finger rub well bilaterally. CN IX: No hypophonia. CN X: Palate elevates  symmetrically. CN XI: Full strength shoulder shrug bilaterally. CN XII: Tongue protrusion full and midline. No atrophy or fasciculations. No significant dysarthria*** Motor: Tone is ***. *** fasciculations in *** extremities. *** atrophy. No grip or percussive myotonia.  Individual muscle group testing (MRC grade out of 5):  Movement     Neck flexion ***    Neck extension ***     Right Left   Shoulder abduction *** ***   Shoulder adduction *** ***   Shoulder ext rotation *** ***   Shoulder int rotation *** ***   Elbow flexion *** ***   Elbow extension *** ***   Wrist extension *** ***    Wrist flexion *** ***   Finger abduction - FDI *** ***   Finger abduction - ADM *** ***   Finger extension *** ***   Finger dist flex - 2/3 *** ***   Finger dist  flex - 4/5 *** ***   Thumb flexion (FPL) *** ***   Thumb abduction (APB) *** ***   Hip flexion *** ***   Hip extension *** ***   Hip adduction *** ***   Hip abduction *** ***   Knee extension *** ***   Knee flexion *** ***   Dorsiflexion *** ***   Plantarflexion *** ***   Inversion *** ***   Eversion *** ***   Great toe extension *** ***   Great toe flexion *** ***     Reflexes:  Right Left   Bicep *** ***   Tricep *** ***   BrRad *** ***   Knee *** ***   Ankle *** ***    Pathological Reflexes: Babinski: *** response bilaterally*** Hoffman: *** Troemner: *** Pectoral: *** Palmomental: *** Facial: *** Midline tap: *** Sensation: Pinprick: *** Vibration: *** Temperature: *** Proprioception: *** Coordination: Intact finger-to- nose-finger and heel-to-shin bilaterally. Romberg negative. Gait: Able to rise from chair with arms crossed unassisted. Normal, narrow-based gait. Able to tandem walk. Able to walk on toes and heels.  Lab and Test Review: Internal labs: Component     Latest Ref Rng 07/27/2021 08/13/2021  WBC     4.0 - 10.5 K/uL  10.1   RBC     3.87 - 5.11 MIL/uL  4.58   Hemoglobin     12.0 - 15.0 g/dL  14.5   HCT     36.0 - 46.0 %  44.0   MCV     80.0 - 100.0 fL  96.1   MCH     26.0 - 34.0 pg  31.7   MCHC     30.0 - 36.0 g/dL  33.0   RDW     11.5 - 15.5 %  12.5   Platelets     150 - 400 K/uL  198   nRBC     0.0 - 0.2 %  0.0   Neutrophils     %  66   NEUT#     1.7 - 7.7 K/uL  6.8   Lymphocytes     %  27   Lymphocyte #     0.7 - 4.0 K/uL  2.7   Monocytes Relative     %  4   Monocyte #     0.1 - 1.0 K/uL  0.4   Eosinophil     %  1   Eosinophils Absolute     0.0 - 0.5 K/uL  0.1   Basophil     %  1   Basophils Absolute  0.0 - 0.1 K/uL  0.1   Immature Granulocytes     %  1   Abs Immature Granulocytes     0.00 - 0.07 K/uL  0.05   Sodium     135 - 145 mmol/L  140   Potassium     3.5 - 5.1 mmol/L  3.5   Chloride     98 - 111  mmol/L  103   CO2     22 - 32 mmol/L  32   Glucose     70 - 99 mg/dL  101 (H)   BUN     6 - 20 mg/dL  8   Creatinine     0.44 - 1.00 mg/dL  1.08 (H)   Calcium     8.9 - 10.3 mg/dL  8.7 (L)   Total Protein     6.5 - 8.1 g/dL  6.9   Albumin     3.5 - 5.0 g/dL  3.4 (L)   AST     15 - 41 U/L  19   ALT     0 - 44 U/L  12   Alkaline Phosphatase     38 - 126 U/L  113   Total Bilirubin     0.3 - 1.2 mg/dL  0.3   GFR, Estimated     >60 mL/min  59 (L)   Anion gap     5 - 15   5   Sed Rate     0 - 22 mm/hr 15    CRP     <1.0 mg/dL 0.8    TSH     0.350 - 4.500 uIU/mL  0.897      External labs: HIV (01/12/21): non-reactive RPR (01/12/21): non-reactive Mg (01/13/21): 1.9  B12 (01/12/21): 211 (lower limit of normal) Folate (01/12/21): 3.9 (low) CK (01/12/21): 18 HbA1c (01/11/21): 5.4 Vit D (11/27/20): 35.8  MRI brain wo contrast (08/13/21): Per my read, no infarct, minimal chronic microvascular ischemia Radiology read: FINDINGS: Brain: Examination mildly degraded by motion artifact.   Cerebral volume within normal limits. Few scattered subcentimeter foci of T2/FLAIR hyperintensity noted involving the supratentorial cerebral white matter, nonspecific, but overall minimal in nature, and felt to be within normal limits for age.   No evidence for acute or subacute infarct. Gray-white matter differentiation maintained. No areas of chronic cortical infarction. No acute or chronic intracranial blood products.   No mass lesion, midline shift or mass effect no hydrocephalus or extra-axial fluid collection. Pituitary gland suprasellar region within normal limits.   Vascular: Major intracranial vascular flow voids are maintained.   Skull and upper cervical spine: Craniocervical junction normal. Bone marrow signal intensity within normal limits. No scalp soft tissue abnormality.   Sinuses/Orbits: Globes and orbital soft tissues within normal limits. Mild chronic mucosal thickening  noted about the paranasal sinuses with sequelae of prior sinus surgery. Small right greater than left mastoid effusions. Visualized nasopharynx unremarkable.   Other: None.   IMPRESSION: Normal brain MRI for age.  No acute intracranial abnormality.  CT head (08/13/21): Per my read: Normal with no acute abnormality. Radiology read: FINDINGS: Brain: Cerebral volume within normal limits for patient age.   No evidence for acute intracranial hemorrhage. No findings to suggest acute large vessel territory infarct. No mass lesion, midline shift, or mass effect. Ventricles are normal in size without evidence for hydrocephalus. No extra-axial fluid collection identified.   Vascular: No hyperdense vessel identified.   Skull: Scalp soft tissues demonstrate no acute  abnormality. Calvarium intact.   Sinuses/Orbits: Globes and orbital soft tissues within normal limits.   Visualized paranasal sinuses are clear. Trace right mastoid effusion, of doubtful significance.   IMPRESSION: Negative head CT. No acute intracranial abnormality.  MRI cervical spine wo contrast (09/03/21): Radiology read: FINDINGS: Alignment: Straightening of the normal cervical lordosis. No substantial sagittal subluxation.   Vertebrae: Vertebral body heights are maintained. No focal marrow edema chest the fracture or discitis/osteomyelitis. No suspicious bone lesions.   Cord: Normal cord signal.   Posterior Fossa, vertebral arteries, paraspinal tissues: Visualized vertebral artery flow voids are maintained. No acute abnormality in the visualized posterior fossa. No paraspinal edema.   Disc levels:   C2-C3: Small posterior disc osteophyte complex and mild left greater than right facet and uncovertebral hypertrophy. Mild left foraminal stenosis. No significant canal or right foraminal stenosis.   C3-C4: Posterior disc osteophyte complex with ligamentum flavum thickening and bilateral facet and uncovertebral  hypertrophy. Resulting moderate to severe canal stenosis and right greater than left foraminal stenosis.   C4-C5: Posterior disc osteophyte complex with bilateral facet uncovertebral hypertrophy. Resulting moderate canal stenosis with severe bilateral foraminal stenosis.   C5-C6: Posterior disc osteophyte complex with right greater than left facet and uncovertebral hypertrophy. Resulting moderate to severe bilateral foraminal stenosis with mild canal stenosis.   C6-C7: Posterior disc osteophyte complex with bilateral facet uncovertebral hypertrophy. Resulting moderate bilateral foraminal stenosis without significant canal stenosis.   C7-T1: Right greater than left facet and uncovertebral hypertrophy resulting moderate right foraminal stenosis. Mild left foraminal stenosis. No significant canal stenosis.   IMPRESSION: 1. At C4-C5, severe bilateral foraminal stenosis with moderate canal stenosis. 2. At C3-C4, moderate to severe canal stenosis and right greater than left foraminal stenosis. 3. At C5-C6, moderate to severe bilateral foraminal stenosis with mild canal stenosis. 4. At C6-C7, moderate bilateral foraminal stenosis. 5. At C7-T1, moderate right foraminal stenosis.  MRI thoracic spine wo contrast (10/03/21): Radiology read: FINDINGS: Alignment:  Physiologic.   Vertebrae: No acute fracture, evidence of discitis, or aggressive bone lesion. T4 vertebral body hemangioma. Chronic L2 vertebral body compression fracture.   Cord:  Normal signal and morphology.   Paraspinal and other soft tissues: No acute paraspinal abnormality.   Disc levels:   Disc spaces: Disc spaces are maintained. Mild disc desiccation throughout the thoracic spine.   T1-T2: No disc protrusion, foraminal stenosis or central canal stenosis.   T2-T3: No disc protrusion, foraminal stenosis or central canal stenosis.   T3-T4: No disc protrusion, foraminal stenosis or central canal stenosis.    T4-T5: No disc protrusion, foraminal stenosis or central canal stenosis.   T5-T6: No disc protrusion, foraminal stenosis or central canal stenosis.   T6-T7: No disc protrusion, foraminal stenosis or central canal stenosis.   T7-T8: No disc protrusion, foraminal stenosis or central canal stenosis.   T8-T9: No disc protrusion, foraminal stenosis or central canal stenosis.   T9-T10: No disc protrusion, foraminal stenosis or central canal stenosis.   T10-T11: No disc protrusion, foraminal stenosis or central canal stenosis.   T11-T12: No disc protrusion, foraminal stenosis or central canal stenosis.   L1-2: Mild broad-based disc bulge. No foraminal or central canal stenosis.   IMPRESSION: 1. No acute osseous injury of the thoracic spine. 2. No significant thoracic spine disc protrusion, foraminal stenosis or central canal stenosis.  ASSESSMENT: MIANA POLITTE is a 61 y.o. female who presents for evaluation of***.*** has a relevant medical history of***. *** neurological examination is pertinent for***. Available diagnostic data  is significant for***. This constellation of symptoms and objective data would most likely localize to***. ***  PLAN: -Blood work: *** ***  -Return to clinic ***  The impression above as well as the plan as outlined below were extensively discussed with the patient (in the company of***) who voiced understanding. All questions were answered to their satisfaction.  The patient was counseled on pertinent fall precautions per the printed material provided today***, and as noted under the "Patient Instructions" section below.  When available, results of the above investigations and possible further recommendations will be communicated to the patient via telephone/MyChart. Patient to call office if not contacted after expected testing turnaround time.   Total time spent reviewing records, interview, history/exam, documentation, and coordination of  care on day of encounter:  *** min   Thank you for allowing me to participate in patient's care.  If I can answer any additional questions, I would be pleased to do so.  Kai Levins, MD   CC: Tawnya Crook, MD [place] [address]  CC: Referring provider: Tawnya Crook, MD [address] [place],  [place] [address]

## 2022-01-01 ENCOUNTER — Ambulatory Visit: Payer: Medicare Other | Admitting: Family Medicine

## 2022-01-01 ENCOUNTER — Ambulatory Visit: Payer: Medicare Other | Admitting: Neurology

## 2022-02-02 ENCOUNTER — Other Ambulatory Visit: Payer: Self-pay | Admitting: Family Medicine

## 2022-02-02 ENCOUNTER — Telehealth: Payer: Self-pay | Admitting: Family Medicine

## 2022-02-02 MED ORDER — BUTALBITAL-APAP-CAFFEINE 50-325-40 MG PO TABS: 1.0000 | ORAL_TABLET | Freq: Four times a day (QID) | ORAL | 0 refills | Status: AC | PRN

## 2022-02-02 MED ORDER — PRAMIPEXOLE DIHYDROCHLORIDE 1 MG PO TABS: 1.0000 mg | ORAL_TABLET | Freq: Two times a day (BID) | ORAL | 1 refills | Status: AC

## 2022-02-02 NOTE — Telephone Encounter (Signed)
..   Encourage patient to contact the pharmacy for refills or they can request refills through [facility]:  Please schedule appointment if longer than 1 year  NEXT APPOINTMENT DATE: NA   MEDICATION:pramipexole (MIRAPEX) 1 MG tablet butalbital-acetaminophen-caffeine (FIORICET) 50-325-40 MG tablet  Is the patient out of medication?  Yes   PHARMACY: [facility] [place]  [phone]    Let patient know to contact pharmacy at the end of the day to make sure medication is ready.  Please notify patient to allow 48-72 hours to process

## 2022-02-02 NOTE — Telephone Encounter (Signed)
Please see message below

## 2022-02-03 NOTE — Telephone Encounter (Signed)
Patient notified and verbalized understanding. 

## 2022-03-08 ENCOUNTER — Other Ambulatory Visit: Payer: Self-pay | Admitting: Neurosurgery

## 2022-03-17 NOTE — Pre-Procedure Instructions (Signed)
Surgical Instructions    Your procedure is scheduled on Friday, November 10th.  Report to [facility] Main Entrance "A" at 06:30 A.M., then check in with the Admitting office.  Call this number if you have problems the morning of surgery:  [phone]   If you have any questions prior to your surgery date call [phone]: Open Monday-Friday 8am-4pm    Remember:  Do not eat after midnight the night before your surgery  You may drink clear liquids until 05:30 AM the morning of your surgery.   Clear liquids allowed are: Water, Non-Citrus Juices (without pulp), Carbonated Beverages, Clear Tea, Black Coffee Only (NO MILK, CREAM OR POWDERED CREAMER of any kind), and Gatorade.    Take these medicines the morning of surgery with A SIP OF WATER  HYDROmorphone (DILAUDID) pramipexole (MIRAPEX)  If needed: DULoxetine (CYMBALTA)  ondansetron (ZOFRAN)    As of today, STOP taking any Aspirin (unless otherwise instructed by your surgeon) Aleve, Naproxen, Ibuprofen, Motrin, Advil, Goody's, BC's, all herbal medications, fish oil, and all vitamins.                     Do NOT Smoke (Tobacco/Vaping) for 24 hours prior to your procedure.  If you use a CPAP at night, you may bring your mask/headgear for your overnight stay.   Contacts, glasses, piercing's, hearing aid's, dentures or partials may not be worn into surgery, please bring cases for these belongings.    For patients admitted to the hospital, discharge time will be determined by your treatment team.   Patients discharged the day of surgery will not be allowed to drive home, and someone needs to stay with them for 24 hours.  SURGICAL WAITING ROOM VISITATION Patients having surgery or a procedure may have no more than 2 support people in the waiting area - these visitors may rotate.   Children under the age of 70 must have an adult with them who is not the patient. If the patient needs to stay at the hospital during part of their  recovery, the visitor guidelines for inpatient rooms apply. Pre-op nurse will coordinate an appropriate time for 1 support person to accompany patient in pre-op.  This support person may not rotate.   Please refer to the [facility] website for the visitor guidelines for Inpatients (after your surgery is over and you are in a regular room).    Special instructions:   [facility]- Preparing For Surgery  Before surgery, you can play an important role. Because skin is not sterile, your skin needs to be as free of germs as possible. You can reduce the number of germs on your skin by washing with CHG (chlorahexidine gluconate) Soap before surgery.  CHG is an antiseptic cleaner which kills germs and bonds with the skin to continue killing germs even after washing.    Oral Hygiene is also important to reduce your risk of infection.  Remember - BRUSH YOUR TEETH THE MORNING OF SURGERY WITH YOUR REGULAR TOOTHPASTE  Please do not use if you have an allergy to CHG or antibacterial soaps. If your skin becomes reddened/irritated stop using the CHG.  Do not shave (including legs and underarms) for at least 48 hours prior to first CHG shower. It is OK to shave your face.  Please follow these instructions carefully.   Shower the NIGHT BEFORE SURGERY and the MORNING OF SURGERY  If you chose to wash your hair, wash your hair first as usual with your normal shampoo.  After you shampoo, rinse your hair and body thoroughly to remove the shampoo.  Use CHG Soap as you would any other liquid soap. You can apply CHG directly to the skin and wash gently with a scrungie or a clean washcloth.   Apply the CHG Soap to your body ONLY FROM THE NECK DOWN.  Do not use on open wounds or open sores. Avoid contact with your eyes, ears, mouth and genitals (private parts). Wash Face and genitals (private parts)  with your normal soap.   Wash thoroughly, paying special attention to the area where your surgery will be  performed.  Thoroughly rinse your body with warm water from the neck down.  DO NOT shower/wash with your normal soap after using and rinsing off the CHG Soap.  Pat yourself dry with a CLEAN TOWEL.  Wear CLEAN PAJAMAS to bed the night before surgery  Place CLEAN SHEETS on your bed the night before your surgery  DO NOT SLEEP WITH PETS.   Day of Surgery: Take a shower with CHG soap. Do not wear jewelry or makeup Do not wear lotions, powders, perfumes, or deodorant. Do not shave 48 hours prior to surgery.   Do not bring valuables to the hospital. [facility] is not responsible for any belongings or valuables. Do not wear nail polish, gel polish, artificial nails, or any other type of covering on natural nails (fingers and toes) If you have artificial nails or gel coating that need to be removed by a nail salon, please have this removed prior to surgery. Artificial nails or gel coating may interfere with anesthesia's ability to adequately monitor your vital signs. Wear Clean/Comfortable clothing the morning of surgery Remember to brush your teeth WITH YOUR REGULAR TOOTHPASTE.   Please read over the following fact sheets that you were given.    If you received a COVID test during your pre-op visit  it is requested that you wear a mask when out in public, stay away from anyone that may not be feeling well and notify your surgeon if you develop symptoms. If you have been in contact with anyone that has tested positive in the last 10 days please notify you surgeon.

## 2022-03-18 ENCOUNTER — Other Ambulatory Visit: Payer: Self-pay

## 2022-03-18 ENCOUNTER — Encounter (HOSPITAL_COMMUNITY): Payer: Self-pay

## 2022-03-18 ENCOUNTER — Encounter (HOSPITAL_COMMUNITY)
Admission: RE | Admit: 2022-03-18 | Discharge: 2022-03-18 | Disposition: A | Discharge status: Home / Self Care | Payer: 59 | Source: Ambulatory Visit | Attending: Pediatrics | Admitting: Pediatrics

## 2022-03-18 ENCOUNTER — Encounter (HOSPITAL_COMMUNITY): Payer: Self-pay | Admitting: Neurosurgery

## 2022-03-18 VITALS — BP 124/65 | HR 78 | Temp 97.6°F | Resp 17 | Ht 67.0 in | Wt 204.0 lb

## 2022-03-18 DIAGNOSIS — N281 Cyst of kidney, acquired: Secondary | ICD-10-CM | POA: Diagnosis not present

## 2022-03-18 DIAGNOSIS — J449 Chronic obstructive pulmonary disease, unspecified: Secondary | ICD-10-CM | POA: Diagnosis not present

## 2022-03-18 DIAGNOSIS — Z9049 Acquired absence of other specified parts of digestive tract: Secondary | ICD-10-CM | POA: Diagnosis not present

## 2022-03-18 DIAGNOSIS — M48061 Spinal stenosis, lumbar region without neurogenic claudication: Secondary | ICD-10-CM | POA: Diagnosis not present

## 2022-03-18 DIAGNOSIS — K219 Gastro-esophageal reflux disease without esophagitis: Secondary | ICD-10-CM | POA: Insufficient documentation

## 2022-03-18 DIAGNOSIS — Z1152 Encounter for screening for COVID-19: Secondary | ICD-10-CM | POA: Diagnosis not present

## 2022-03-18 DIAGNOSIS — F32A Depression, unspecified: Secondary | ICD-10-CM | POA: Diagnosis not present

## 2022-03-18 DIAGNOSIS — F419 Anxiety disorder, unspecified: Secondary | ICD-10-CM | POA: Insufficient documentation

## 2022-03-18 DIAGNOSIS — R9431 Abnormal electrocardiogram [ECG] [EKG]: Secondary | ICD-10-CM | POA: Insufficient documentation

## 2022-03-18 DIAGNOSIS — Z85828 Personal history of other malignant neoplasm of skin: Secondary | ICD-10-CM | POA: Insufficient documentation

## 2022-03-18 DIAGNOSIS — Z89611 Acquired absence of right leg above knee: Secondary | ICD-10-CM | POA: Insufficient documentation

## 2022-03-18 DIAGNOSIS — Z87891 Personal history of nicotine dependence: Secondary | ICD-10-CM | POA: Diagnosis not present

## 2022-03-18 DIAGNOSIS — Z01818 Encounter for other preprocedural examination: Secondary | ICD-10-CM

## 2022-03-18 DIAGNOSIS — Z01812 Encounter for preprocedural laboratory examination: Secondary | ICD-10-CM | POA: Insufficient documentation

## 2022-03-18 DIAGNOSIS — G4733 Obstructive sleep apnea (adult) (pediatric): Secondary | ICD-10-CM | POA: Insufficient documentation

## 2022-03-18 DIAGNOSIS — M4802 Spinal stenosis, cervical region: Secondary | ICD-10-CM | POA: Insufficient documentation

## 2022-03-18 DIAGNOSIS — M5137 Other intervertebral disc degeneration, lumbosacral region: Secondary | ICD-10-CM | POA: Insufficient documentation

## 2022-03-18 HISTORY — DX: Osteomyelitis, unspecified: M86.9

## 2022-03-18 LAB — CBC
HCT: 41.9 % (ref 36.0–46.0)
Hemoglobin: 13.2 g/dL (ref 12.0–15.0)
MCH: 30.6 pg (ref 26.0–34.0)
MCHC: 31.5 g/dL (ref 30.0–36.0)
MCV: 97 fL (ref 80.0–100.0)
Platelets: 208 10*3/uL (ref 150–400)
RBC: 4.32 MIL/uL (ref 3.87–5.11)
RDW: 13.4 % (ref 11.5–15.5)
WBC: 9.3 10*3/uL (ref 4.0–10.5)
nRBC: 0 % (ref 0.0–0.2)

## 2022-03-18 LAB — SURGICAL PCR SCREEN
MRSA, PCR: NEGATIVE
Staphylococcus aureus: POSITIVE — AB

## 2022-03-18 NOTE — Progress Notes (Signed)
PCP - Dr. Carlynn Purl Cardiologist - Denies  PPM/ICD - Denies  Chest x-ray - N/A EKG - N/A Stress Test - 2019 ECHO - Denies Cardiac Cath - Denies  Sleep Study - OSA, patient says she needs a BiPAP machine at night. Doesn't use anything at night.  Diabetes: Denies  Blood Thinner Instructions: N/A Aspirin Instructions: N/A  ERAS Protcol - Yes PRE-SURGERY Ensure or G2- No  COVID TEST- N/A   Anesthesia review: Yes, patient says she has difficulty breathing when waking up from anesthesia. She states that the last time it happened was 2-3 years ago and they had to put her on BiPAP and send her to the ICU.  Patient denies shortness of breath, fever, cough and chest pain at PAT appointment   All instructions explained to the patient, with a verbal understanding of the material. Patient agrees to go over the instructions while at home for a better understanding. Patient also instructed to self quarantine after being tested for COVID-19. The opportunity to ask questions was provided.

## 2022-03-18 NOTE — Pre-Procedure Instructions (Signed)
Surgical Instructions    Your procedure is scheduled on Friday, November 10th.  Report to [facility] Main Entrance "A" at 06:30 A.M., then check in with the Admitting office.  Call this number if you have problems the morning of surgery:  [phone]   If you have any questions prior to your surgery date call [phone]: Open Monday-Friday 8am-4pm    Remember:  Do not eat after midnight the night before your surgery  You may drink clear liquids until 05:30 AM the morning of your surgery.   Clear liquids allowed are: Water, Non-Citrus Juices (without pulp), Carbonated Beverages, Clear Tea, Black Coffee Only (NO MILK, CREAM OR POWDERED CREAMER of any kind), and Gatorade.    Take these medicines the morning of surgery with A SIP OF WATER  HYDROmorphone (DILAUDID) pramipexole (MIRAPEX) pregabalin (LYRICA)   If needed: DULoxetine (CYMBALTA)  ondansetron (ZOFRAN)    As of today, STOP taking any Aspirin (unless otherwise instructed by your surgeon) Aleve, Naproxen, Ibuprofen, Motrin, Advil, Goody's, BC's, all herbal medications, fish oil, and all vitamins.                     Do NOT Smoke (Tobacco/Vaping) for 24 hours prior to your procedure.  If you use a CPAP at night, you may bring your mask/headgear for your overnight stay.   Contacts, glasses, piercing's, hearing aid's, dentures or partials may not be worn into surgery, please bring cases for these belongings.    For patients admitted to the hospital, discharge time will be determined by your treatment team.   Patients discharged the day of surgery will not be allowed to drive home, and someone needs to stay with them for 24 hours.  SURGICAL WAITING ROOM VISITATION Patients having surgery or a procedure may have no more than 2 support people in the waiting area - these visitors may rotate.   Children under the age of 10 must have an adult with them who is not the patient. If the patient needs to stay at the hospital  during part of their recovery, the visitor guidelines for inpatient rooms apply. Pre-op nurse will coordinate an appropriate time for 1 support person to accompany patient in pre-op.  This support person may not rotate.   Please refer to the [facility] website for the visitor guidelines for Inpatients (after your surgery is over and you are in a regular room).    Special instructions:   [facility]- Preparing For Surgery  Before surgery, you can play an important role. Because skin is not sterile, your skin needs to be as free of germs as possible. You can reduce the number of germs on your skin by washing with CHG (chlorahexidine gluconate) Soap before surgery.  CHG is an antiseptic cleaner which kills germs and bonds with the skin to continue killing germs even after washing.    Oral Hygiene is also important to reduce your risk of infection.  Remember - BRUSH YOUR TEETH THE MORNING OF SURGERY WITH YOUR REGULAR TOOTHPASTE  Please do not use if you have an allergy to CHG or antibacterial soaps. If your skin becomes reddened/irritated stop using the CHG.  Do not shave (including legs and underarms) for at least 48 hours prior to first CHG shower. It is OK to shave your face.  Please follow these instructions carefully.   Shower the NIGHT BEFORE SURGERY and the MORNING OF SURGERY  If you chose to wash your hair, wash your hair first as usual with  your normal shampoo.  After you shampoo, rinse your hair and body thoroughly to remove the shampoo.  Use CHG Soap as you would any other liquid soap. You can apply CHG directly to the skin and wash gently with a scrungie or a clean washcloth.   Apply the CHG Soap to your body ONLY FROM THE NECK DOWN.  Do not use on open wounds or open sores. Avoid contact with your eyes, ears, mouth and genitals (private parts). Wash Face and genitals (private parts)  with your normal soap.   Wash thoroughly, paying special attention to the area where your surgery  will be performed.  Thoroughly rinse your body with warm water from the neck down.  DO NOT shower/wash with your normal soap after using and rinsing off the CHG Soap.  Pat yourself dry with a CLEAN TOWEL.  Wear CLEAN PAJAMAS to bed the night before surgery  Place CLEAN SHEETS on your bed the night before your surgery  DO NOT SLEEP WITH PETS.   Day of Surgery: Take a shower with CHG soap. Do not wear jewelry or makeup Do not wear lotions, powders, perfumes, or deodorant. Do not shave 48 hours prior to surgery.   Do not bring valuables to the hospital. [facility] is not responsible for any belongings or valuables. Do not wear nail polish, gel polish, artificial nails, or any other type of covering on natural nails (fingers and toes) If you have artificial nails or gel coating that need to be removed by a nail salon, please have this removed prior to surgery. Artificial nails or gel coating may interfere with anesthesia's ability to adequately monitor your vital signs. Wear Clean/Comfortable clothing the morning of surgery Remember to brush your teeth WITH YOUR REGULAR TOOTHPASTE.   Please read over the following fact sheets that you were given.    If you received a COVID test during your pre-op visit  it is requested that you wear a mask when out in public, stay away from anyone that may not be feeling well and notify your surgeon if you develop symptoms. If you have been in contact with anyone that has tested positive in the last 10 days please notify you surgeon.

## 2022-03-19 ENCOUNTER — Encounter (HOSPITAL_COMMUNITY): Payer: Self-pay

## 2022-03-19 NOTE — Progress Notes (Signed)
Anesthesia Chart Review:  Case: [id number] Date/Time: 03/26/22 0815   Procedure: C3-4 C4-5 ACDF - RM 19/3C   Anesthesia type: General   Pre-op diagnosis: CERVICAL STENOSIS   Location: [place] OR ROOM 68 / [place] OR   Surgeons: Ashok Pall, MD       DISCUSSION: Renee Pitts is a 61 year old Pitts scheduled for the above procedure.   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 Renee Pitts reported difficulty breathing after her right AKA surgery and had to go to the ICU. (I don't have the records, but some notes suggest it was following additional pain medications and may have had acute respiratory failure requiring BiPAP. Renee Pitts did see cardiology afterwards in 02/2018 for CV disease screening and had a non-ischemic stress test, EF 55%. Renee Pitts has since been diagnosed with OSA, but is not currently using BiPAP.)  Anesthesia team to evaluate on the day of surgery.  Needs repeat T&S secondary to antibodies.   VS: BP 124/65   Pulse 78   Temp 36.4 C   Resp 17   Ht '5\' 7"'$  (1.702 m)   Wt 92.5 kg   SpO2 96%   BMI 31.95 kg/m    PROVIDERS: Carlynn Purl, MD is PCP (establish care 03/16/22).  Renee Pitts noted plans for neck surgery. Gerrit Heck, DO is GI - Seen by hematologist Clydene Laming, MD ([place]) in 2021 for follow-up non-occlusive right femoral vein DVT 01/06/19. Immobility felt to be the provoking factor, possibly COVID contributing. S/p course of Eliquis. Since single episode, felt okay to come off Eliquis. 11/28/20 [place] was negative for RLE DVT.     LABS: Preoperative CBC normal.  (all labs ordered are listed, but only abnormal  results are displayed)  Labs Reviewed  SURGICAL PCR SCREEN - Abnormal; Notable for the following components:      Result Value   Staphylococcus aureus POSITIVE (*)    All other components within normal limits  CBC  TYPE AND SCREEN     IMAGES: 1V PCXR 10/05/21: FINDINGS: The heart size and mediastinal contours are within normal limits. Both lungs are clear. The visualized skeletal structures are unremarkable. IMPRESSION: No active disease.  MRI T-spine 09/2021: IMPRESSION: 1. No acute osseous injury of the thoracic spine. 2. No significant thoracic spine disc protrusion, foraminal stenosis or central canal stenosis.  MRI C-spine 09/03/21: IMPRESSION: 1. At C4-C5, severe bilateral foraminal stenosis with moderate canal stenosis. 2. At C3-C4, moderate to severe canal stenosis and right greater than left foraminal stenosis. 3. At C5-C6, moderate to severe bilateral foraminal stenosis with mild canal stenosis. 4. At C6-C7, moderate bilateral foraminal stenosis. 5. At C7-T1, moderate right foraminal stenosis.  MRI L-spine 01/12/21 ([facility]): 1.  Small disc bulge at L5-S1 which contacts the descending right S1 nerve root.  2.  Mild bilateral neural foraminal narrowing at L5-S1.  3.  No substantial canal stenosis.  4.  Additional mild multilevel degenerative changes as described in the body of the report.  5.  Left lower pole hemorrhagic renal cyst.    EKG: EKG from 03/25/21 requested: By narrative in [facility] (done during admission for left corneal ulcer):  Sinus rhythm  Nonspecific T wave abnormality aVL  Low Limb  Lead Voltage  When compared with ECG of 25-Jan-2021 07:54,  Evidence for Inferior infarct are no longer present  ST no longer depressed in anterior leads  T wave inversion no longer evident in anterolateral leads  Confirmed by Howell Rucks ([id number]) on 03/26/2021 9:57:06 AM    CV: Nuclear stress test 03/21/18 ([facility]): PERFUSION  The SPECT images  demonstrate homogenous tracer distribution throughout the myocardium on stress and rest images. There is no evidence of ischemia or infarction on perfusion images.   WALL MOTION AND EJECTION FRACTION  Gated SPECT analysis revealed normal size left ventricle with normal wall motion and thickness with an ejection fraction of 55%.    Past Medical History:  Diagnosis Date   Allergy    Anxiety    Arthritis    Blood transfusion without reported diagnosis    Cancer ([place])    Skin   Complication of anesthesia    Difficulty breathing when waking up; last happened 2-3 years ago   COPD (chronic obstructive pulmonary disease) ([place])    Depression    GERD (gastroesophageal reflux disease)    Osteomyelitis ([facility])    Sleep apnea    Tuberculosis    Skin test posituve only    Past Surgical History:  Procedure Laterality Date   ABDOMINAL HYSTERECTOMY     endometriosis   APPENDECTOMY     BREAST SURGERY     CHOLECYSTECTOMY     COLON SURGERY     "twisted colon"   COLONOSCOPY     FRACTURE SURGERY     JOINT REPLACEMENT Right    mult R knee   LEG AMPUTATION THROUGH FEMUR Right     MEDICATIONS:  butalbital-acetaminophen-caffeine (FIORICET) 50-325-40 MG tablet   dicyclomine (BENTYL) 10 MG capsule   DULoxetine (CYMBALTA) 60 MG capsule   HYDROmorphone (DILAUDID) 2 MG tablet   mupirocin ointment (BACTROBAN) 2 %   ondansetron (ZOFRAN) 4 MG tablet   pramipexole (MIRAPEX) 1 MG tablet   pregabalin (LYRICA) 150 MG capsule   tiZANidine (ZANAFLEX) 4 MG tablet   traZODone (DESYREL) 50 MG tablet   No current facility-administered medications for this encounter.    Myra Gianotti, PA-C Surgical Short Stay/Anesthesiology [facility] Phone [phone] [facility] Phone [phone] 03/19/2022 8:02 PM

## 2022-03-22 NOTE — Anesthesia Preprocedure Evaluation (Addendum)
Anesthesia Evaluation  Patient identified by MRN, date of birth, ID band Patient awake    Reviewed: Allergy & Precautions, NPO status , Patient's Chart, lab work & pertinent test results  History of Anesthesia Complications Negative for: history of anesthetic complications  Airway Mallampati: III  TM Distance: >3 FB Neck ROM: Full    Dental  (+) Edentulous Upper, Edentulous Lower   Pulmonary sleep apnea , COPD, Current Smoker and Patient abstained from smoking.   Pulmonary exam normal        Cardiovascular + DVT  Normal cardiovascular exam     Neuro/Psych  PSYCHIATRIC DISORDERS Anxiety Depression     B/l upper extremity numbness at baseline    GI/Hepatic ,GERD  Controlled,,  Endo/Other  negative endocrine ROS    Renal/GU negative Renal ROS     Musculoskeletal  (+) Arthritis ,  narcotic dependent  Abdominal   Peds  Hematology negative hematology ROS (+)   Anesthesia Other Findings Chronic pain syndrome   Reproductive/Obstetrics                             Anesthesia Physical Anesthesia Plan  ASA: 3  Anesthesia Plan: General   Post-op Pain Management: Tylenol PO (pre-op)*, Toradol IV (intra-op)* and Ketamine IV*   Induction: Intravenous  PONV Risk Score and Plan: 3 and Treatment may vary due to age or medical condition, Ondansetron, Dexamethasone and Midazolam  Airway Management Planned: Oral ETT and Video Laryngoscope Planned  Additional Equipment: None  Intra-op Plan:   Post-operative Plan: Extubation in OR  Informed Consent: I have reviewed the patients History and Physical, chart, labs and discussed the procedure including the risks, benefits and alternatives for the proposed anesthesia with the patient or authorized representative who has indicated his/her understanding and acceptance.     Dental advisory given  Plan Discussed with: CRNA and  Anesthesiologist  Anesthesia Plan Comments: ( )       Anesthesia Quick Evaluation

## 2022-03-26 ENCOUNTER — Ambulatory Visit (HOSPITAL_COMMUNITY): Payer: 59 | Admitting: Vascular Surgery

## 2022-03-26 ENCOUNTER — Encounter (HOSPITAL_COMMUNITY): Payer: Self-pay | Admitting: Neurosurgery

## 2022-03-26 ENCOUNTER — Observation Stay (HOSPITAL_COMMUNITY)
Admission: RE | Admit: 2022-03-26 | Discharge: 2022-03-27 | Disposition: A | Discharge status: Home / Self Care | Payer: 59 | Attending: Neurosurgery | Admitting: Neurosurgery

## 2022-03-26 ENCOUNTER — Ambulatory Visit (HOSPITAL_BASED_OUTPATIENT_CLINIC_OR_DEPARTMENT_OTHER): Payer: 59 | Admitting: Anesthesiology

## 2022-03-26 ENCOUNTER — Other Ambulatory Visit: Payer: Self-pay

## 2022-03-26 ENCOUNTER — Encounter (HOSPITAL_COMMUNITY)
Admission: RE | Disposition: A | Discharge status: Home / Self Care | Payer: Self-pay | Source: Home / Self Care | Attending: Neurosurgery

## 2022-03-26 ENCOUNTER — Ambulatory Visit (HOSPITAL_COMMUNITY): Payer: 59

## 2022-03-26 DIAGNOSIS — J449 Chronic obstructive pulmonary disease, unspecified: Secondary | ICD-10-CM

## 2022-03-26 DIAGNOSIS — Z96651 Presence of right artificial knee joint: Secondary | ICD-10-CM | POA: Insufficient documentation

## 2022-03-26 DIAGNOSIS — Z86718 Personal history of other venous thrombosis and embolism: Secondary | ICD-10-CM | POA: Diagnosis not present

## 2022-03-26 DIAGNOSIS — F418 Other specified anxiety disorders: Secondary | ICD-10-CM | POA: Diagnosis not present

## 2022-03-26 DIAGNOSIS — F17210 Nicotine dependence, cigarettes, uncomplicated: Secondary | ICD-10-CM | POA: Insufficient documentation

## 2022-03-26 DIAGNOSIS — Z85828 Personal history of other malignant neoplasm of skin: Secondary | ICD-10-CM | POA: Insufficient documentation

## 2022-03-26 DIAGNOSIS — M47892 Other spondylosis, cervical region: Secondary | ICD-10-CM | POA: Diagnosis not present

## 2022-03-26 DIAGNOSIS — M4802 Spinal stenosis, cervical region: Secondary | ICD-10-CM | POA: Diagnosis present

## 2022-03-26 HISTORY — PX: ANTERIOR CERVICAL DECOMP/DISCECTOMY FUSION: SHX1161

## 2022-03-26 HISTORY — DX: Other complications of anesthesia, initial encounter: T88.59XA

## 2022-03-26 LAB — TYPE AND SCREEN
ABO/RH(D): O POS
Antibody Screen: POSITIVE

## 2022-03-26 SURGERY — ANTERIOR CERVICAL DECOMPRESSION/DISCECTOMY FUSION 2 LEVELS
Anesthesia: General | Site: Spine Cervical

## 2022-03-26 MED ORDER — THROMBIN 20000 UNITS EX SOLR
CUTANEOUS | Status: AC
  Filled 2022-03-26: qty 20000

## 2022-03-26 MED ORDER — HYDROMORPHONE HCL 2 MG PO TABS: 2.0000 mg | ORAL_TABLET | Freq: Four times a day (QID) | ORAL | Status: DC | PRN

## 2022-03-26 MED ORDER — BUTALBITAL-APAP-CAFFEINE 50-325-40 MG PO TABS
1.0000 | ORAL_TABLET | Freq: Four times a day (QID) | ORAL | Status: DC | PRN
  Administered 2022-03-27: 1 via ORAL
  Filled 2022-03-26: qty 1

## 2022-03-26 MED ORDER — PROPOFOL 10 MG/ML IV BOLUS
INTRAVENOUS | Status: AC
  Filled 2022-03-26: qty 20

## 2022-03-26 MED ORDER — PHENYLEPHRINE HCL-NACL 20-0.9 MG/250ML-% IV SOLN
INTRAVENOUS | Status: DC | PRN
  Administered 2022-03-26: 25 ug/min via INTRAVENOUS

## 2022-03-26 MED ORDER — PHENOL 1.4 % MT LIQD: 1.0000 | OROMUCOSAL | Status: DC | PRN

## 2022-03-26 MED ORDER — SUGAMMADEX SODIUM 200 MG/2ML IV SOLN
INTRAVENOUS | Status: DC | PRN
  Administered 2022-03-26: 200 mg via INTRAVENOUS

## 2022-03-26 MED ORDER — ONDANSETRON HCL 4 MG/2ML IJ SOLN: 4.0000 mg | Freq: Once | INTRAMUSCULAR | Status: DC | PRN

## 2022-03-26 MED ORDER — OXYCODONE HCL 5 MG PO TABS
ORAL_TABLET | ORAL | Status: AC
  Filled 2022-03-26: qty 1

## 2022-03-26 MED ORDER — FENTANYL CITRATE (PF) 250 MCG/5ML IJ SOLN
INTRAMUSCULAR | Status: AC
  Filled 2022-03-26: qty 5

## 2022-03-26 MED ORDER — ONDANSETRON HCL 4 MG/2ML IJ SOLN: 4.0000 mg | Freq: Four times a day (QID) | INTRAMUSCULAR | Status: DC | PRN

## 2022-03-26 MED ORDER — ROCURONIUM BROMIDE 10 MG/ML (PF) SYRINGE
PREFILLED_SYRINGE | INTRAVENOUS | Status: DC | PRN
  Administered 2022-03-26: 60 mg via INTRAVENOUS

## 2022-03-26 MED ORDER — KETOROLAC TROMETHAMINE 30 MG/ML IJ SOLN
INTRAMUSCULAR | Status: DC | PRN
  Administered 2022-03-26: 30 mg via INTRAVENOUS

## 2022-03-26 MED ORDER — ONDANSETRON HCL 4 MG PO TABS: 4.0000 mg | ORAL_TABLET | Freq: Four times a day (QID) | ORAL | Status: DC | PRN

## 2022-03-26 MED ORDER — DEXAMETHASONE SODIUM PHOSPHATE 10 MG/ML IJ SOLN
INTRAMUSCULAR | Status: DC | PRN
  Administered 2022-03-26: 10 mg via INTRAVENOUS

## 2022-03-26 MED ORDER — CHLORHEXIDINE GLUCONATE CLOTH 2 % EX PADS: 6.0000 | MEDICATED_PAD | Freq: Once | CUTANEOUS | Status: DC

## 2022-03-26 MED ORDER — NYSTATIN 100000 UNIT/GM EX POWD
Freq: Two times a day (BID) | CUTANEOUS | Status: DC
  Filled 2022-03-26: qty 15

## 2022-03-26 MED ORDER — ONDANSETRON HCL 4 MG/2ML IJ SOLN
INTRAMUSCULAR | Status: DC | PRN
  Administered 2022-03-26 (×2): 4 mg via INTRAVENOUS

## 2022-03-26 MED ORDER — THROMBIN 5000 UNITS EX SOLR
CUTANEOUS | Status: AC
  Filled 2022-03-26: qty 5000

## 2022-03-26 MED ORDER — FENTANYL CITRATE (PF) 250 MCG/5ML IJ SOLN
INTRAMUSCULAR | Status: DC | PRN
  Administered 2022-03-26 (×5): 50 ug via INTRAVENOUS

## 2022-03-26 MED ORDER — POTASSIUM CHLORIDE IN NACL 20-0.9 MEQ/L-% IV SOLN: INTRAVENOUS | Status: DC

## 2022-03-26 MED ORDER — CHLORHEXIDINE GLUCONATE 0.12 % MT SOLN
15.0000 mL | Freq: Once | OROMUCOSAL | Status: AC
  Administered 2022-03-26: 15 mL via OROMUCOSAL
  Filled 2022-03-26: qty 15

## 2022-03-26 MED ORDER — SUCCINYLCHOLINE CHLORIDE 200 MG/10ML IV SOSY
PREFILLED_SYRINGE | INTRAVENOUS | Status: DC | PRN
  Administered 2022-03-26: 120 mg via INTRAVENOUS

## 2022-03-26 MED ORDER — PHENYLEPHRINE 80 MCG/ML (10ML) SYRINGE FOR IV PUSH (FOR BLOOD PRESSURE SUPPORT)
PREFILLED_SYRINGE | INTRAVENOUS | Status: DC | PRN
  Administered 2022-03-26 (×2): 80 ug via INTRAVENOUS

## 2022-03-26 MED ORDER — ONDANSETRON HCL 4 MG PO TABS: 4.0000 mg | ORAL_TABLET | Freq: Three times a day (TID) | ORAL | Status: DC | PRN

## 2022-03-26 MED ORDER — PREGABALIN 75 MG PO CAPS
150.0000 mg | ORAL_CAPSULE | Freq: Three times a day (TID) | ORAL | Status: DC
  Administered 2022-03-26 (×2): 150 mg via ORAL
  Filled 2022-03-26 (×2): qty 2

## 2022-03-26 MED ORDER — LIDOCAINE-EPINEPHRINE 0.5 %-1:200000 IJ SOLN
INTRAMUSCULAR | Status: AC
  Filled 2022-03-26: qty 1

## 2022-03-26 MED ORDER — LACTATED RINGERS IV SOLN: INTRAVENOUS | Status: DC | PRN

## 2022-03-26 MED ORDER — OXYCODONE HCL 5 MG PO TABS
10.0000 mg | ORAL_TABLET | ORAL | Status: DC | PRN
  Administered 2022-03-26 – 2022-03-27 (×4): 10 mg via ORAL
  Filled 2022-03-26 (×4): qty 2

## 2022-03-26 MED ORDER — FENTANYL CITRATE (PF) 100 MCG/2ML IJ SOLN
25.0000 ug | INTRAMUSCULAR | Status: DC | PRN
  Administered 2022-03-26: 50 ug via INTRAVENOUS
  Administered 2022-03-26 (×4): 25 ug via INTRAVENOUS

## 2022-03-26 MED ORDER — ALBUTEROL SULFATE HFA 108 (90 BASE) MCG/ACT IN AERS
INHALATION_SPRAY | RESPIRATORY_TRACT | Status: DC | PRN
  Administered 2022-03-26: 4 via RESPIRATORY_TRACT

## 2022-03-26 MED ORDER — MIDAZOLAM HCL 5 MG/5ML IJ SOLN
INTRAMUSCULAR | Status: DC | PRN
  Administered 2022-03-26: 2 mg via INTRAVENOUS

## 2022-03-26 MED ORDER — OXYCODONE HCL 5 MG/5ML PO SOLN: 5.0000 mg | Freq: Once | ORAL | Status: AC | PRN

## 2022-03-26 MED ORDER — 0.9 % SODIUM CHLORIDE (POUR BTL) OPTIME
TOPICAL | Status: DC | PRN
  Administered 2022-03-26: 1000 mL

## 2022-03-26 MED ORDER — DIAZEPAM 5 MG PO TABS: 5.0000 mg | ORAL_TABLET | Freq: Four times a day (QID) | ORAL | Status: DC | PRN

## 2022-03-26 MED ORDER — HEMOSTATIC AGENTS (NO CHARGE) OPTIME
TOPICAL | Status: DC | PRN
  Administered 2022-03-26: 1 via TOPICAL

## 2022-03-26 MED ORDER — KETAMINE HCL 10 MG/ML IJ SOLN
INTRAMUSCULAR | Status: DC | PRN
  Administered 2022-03-26 (×2): 10 mg via INTRAVENOUS
  Administered 2022-03-26: 30 mg via INTRAVENOUS

## 2022-03-26 MED ORDER — MENTHOL 3 MG MT LOZG: 1.0000 | LOZENGE | OROMUCOSAL | Status: DC | PRN

## 2022-03-26 MED ORDER — SODIUM CHLORIDE 0.9% FLUSH: 3.0000 mL | Freq: Two times a day (BID) | INTRAVENOUS | Status: DC

## 2022-03-26 MED ORDER — FENTANYL CITRATE (PF) 100 MCG/2ML IJ SOLN
INTRAMUSCULAR | Status: AC
  Filled 2022-03-26: qty 2

## 2022-03-26 MED ORDER — ORAL CARE MOUTH RINSE: 15.0000 mL | Freq: Once | OROMUCOSAL | Status: AC

## 2022-03-26 MED ORDER — LACTATED RINGERS IV SOLN: INTRAVENOUS | Status: DC

## 2022-03-26 MED ORDER — ACETAMINOPHEN 650 MG RE SUPP: 650.0000 mg | RECTAL | Status: DC | PRN

## 2022-03-26 MED ORDER — CELECOXIB 200 MG PO CAPS
200.0000 mg | ORAL_CAPSULE | Freq: Two times a day (BID) | ORAL | Status: DC
  Administered 2022-03-26: 200 mg via ORAL
  Filled 2022-03-26: qty 1

## 2022-03-26 MED ORDER — HYDROMORPHONE HCL 1 MG/ML IJ SOLN: 1.0000 mg | INTRAMUSCULAR | Status: DC | PRN

## 2022-03-26 MED ORDER — ACETAMINOPHEN 325 MG PO TABS
650.0000 mg | ORAL_TABLET | ORAL | Status: DC | PRN
  Filled 2022-03-26 (×2): qty 2

## 2022-03-26 MED ORDER — PRAMIPEXOLE DIHYDROCHLORIDE 0.25 MG PO TABS
1.0000 mg | ORAL_TABLET | Freq: Two times a day (BID) | ORAL | Status: DC
  Administered 2022-03-26: 1 mg via ORAL
  Filled 2022-03-26: qty 4

## 2022-03-26 MED ORDER — SENNA 8.6 MG PO TABS
1.0000 | ORAL_TABLET | Freq: Two times a day (BID) | ORAL | Status: DC
  Administered 2022-03-26: 8.6 mg via ORAL
  Filled 2022-03-26: qty 1

## 2022-03-26 MED ORDER — SODIUM CHLORIDE 0.9 % IV SOLN
250.0000 mL | INTRAVENOUS | Status: DC
  Administered 2022-03-26: 250 mL via INTRAVENOUS

## 2022-03-26 MED ORDER — SODIUM CHLORIDE 0.9% FLUSH: 3.0000 mL | INTRAVENOUS | Status: DC | PRN

## 2022-03-26 MED ORDER — OXYCODONE HCL 5 MG PO TABS
5.0000 mg | ORAL_TABLET | Freq: Once | ORAL | Status: AC | PRN
  Administered 2022-03-26: 5 mg via ORAL

## 2022-03-26 MED ORDER — PROPOFOL 10 MG/ML IV BOLUS
INTRAVENOUS | Status: DC | PRN
  Administered 2022-03-26: 150 mg via INTRAVENOUS
  Administered 2022-03-26: 30 mg via INTRAVENOUS

## 2022-03-26 MED ORDER — ACETAMINOPHEN 500 MG PO TABS
1000.0000 mg | ORAL_TABLET | Freq: Once | ORAL | Status: AC
  Administered 2022-03-26: 1000 mg via ORAL
  Filled 2022-03-26: qty 2

## 2022-03-26 MED ORDER — MIDAZOLAM HCL 2 MG/2ML IJ SOLN
INTRAMUSCULAR | Status: AC
  Filled 2022-03-26: qty 2

## 2022-03-26 MED ORDER — LIDOCAINE 2% (20 MG/ML) 5 ML SYRINGE
INTRAMUSCULAR | Status: DC | PRN
  Administered 2022-03-26: 40 mg via INTRAVENOUS
  Administered 2022-03-26: 60 mg via INTRAVENOUS

## 2022-03-26 MED ORDER — KETAMINE HCL 50 MG/5ML IJ SOSY
PREFILLED_SYRINGE | INTRAMUSCULAR | Status: AC
  Filled 2022-03-26: qty 5

## 2022-03-26 MED ORDER — LIDOCAINE-EPINEPHRINE 0.5 %-1:200000 IJ SOLN
INTRAMUSCULAR | Status: DC | PRN
  Administered 2022-03-26: 3 mL

## 2022-03-26 MED ORDER — DULOXETINE HCL 30 MG PO CPEP
60.0000 mg | ORAL_CAPSULE | Freq: Every day | ORAL | Status: DC | PRN
  Administered 2022-03-27: 60 mg via ORAL
  Filled 2022-03-26: qty 2

## 2022-03-26 MED ORDER — CEFAZOLIN SODIUM-DEXTROSE 2-4 GM/100ML-% IV SOLN
2.0000 g | INTRAVENOUS | Status: AC
  Administered 2022-03-26: 2 g via INTRAVENOUS
  Filled 2022-03-26: qty 100

## 2022-03-26 MED ORDER — THROMBIN 5000 UNITS EX SOLR
CUTANEOUS | Status: DC | PRN
  Administered 2022-03-26: 5000 [IU] via TOPICAL

## 2022-03-26 MED ORDER — OXYCODONE HCL 5 MG PO TABS: 5.0000 mg | ORAL_TABLET | ORAL | Status: DC | PRN

## 2022-03-26 SURGICAL SUPPLY — 46 items
ADH SKN CLS APL DERMABOND .7 (GAUZE/BANDAGES/DRESSINGS) ×1
ALLOGRAFT 7X14X11 (Bone Implant) IMPLANT
BAG COUNTER SPONGE SURGICOUNT (BAG) ×1 IMPLANT
BAG SPNG CNTER NS LX DISP (BAG) ×1
BAND INSRT 18 STRL LF DISP RB (MISCELLANEOUS) ×2
BAND RUBBER #18 3X1/16 STRL (MISCELLANEOUS) ×2 IMPLANT
BUR DRUM 4.0 (BURR) ×1 IMPLANT
BUR MATCHSTICK NEURO 3.0 LAGG (BURR) ×1 IMPLANT
CANISTER SUCT 3000ML PPV (MISCELLANEOUS) ×1 IMPLANT
DERMABOND ADVANCED .7 DNX12 (GAUZE/BANDAGES/DRESSINGS) ×1 IMPLANT
DRAPE HALF SHEET 40X57 (DRAPES) IMPLANT
DRAPE LAPAROTOMY 100X72 PEDS (DRAPES) ×1 IMPLANT
DRAPE MICROSCOPE SLANT 54X150 (MISCELLANEOUS) ×1 IMPLANT
DURAPREP 6ML APPLICATOR 50/CS (WOUND CARE) ×1 IMPLANT
ELECT COATED BLADE 2.86 ST (ELECTRODE) ×1 IMPLANT
ELECT REM PT RETURN 9FT ADLT (ELECTROSURGICAL) ×1
ELECTRODE REM PT RTRN 9FT ADLT (ELECTROSURGICAL) ×1 IMPLANT
GAUZE 4X4 16PLY ~~LOC~~+RFID DBL (SPONGE) IMPLANT
GLOVE ECLIPSE 6.5 STRL STRAW (GLOVE) ×1 IMPLANT
GLOVE EXAM NITRILE XL STR (GLOVE) IMPLANT
GOWN STRL REUS W/ TWL LRG LVL3 (GOWN DISPOSABLE) ×3 IMPLANT
GOWN STRL REUS W/ TWL XL LVL3 (GOWN DISPOSABLE) IMPLANT
GOWN STRL REUS W/TWL 2XL LVL3 (GOWN DISPOSABLE) IMPLANT
GOWN STRL REUS W/TWL LRG LVL3 (GOWN DISPOSABLE) ×3
GOWN STRL REUS W/TWL XL LVL3 (GOWN DISPOSABLE)
GRAFT CORT CANC 14X8.25X11 5D (Bone Implant) IMPLANT
KIT BASIN OR (CUSTOM PROCEDURE TRAY) ×1 IMPLANT
KIT TURNOVER KIT B (KITS) ×1 IMPLANT
NDL HYPO 25X1 1.5 SAFETY (NEEDLE) ×1 IMPLANT
NDL SPNL 22GX3.5 QUINCKE BK (NEEDLE) ×1 IMPLANT
NEEDLE HYPO 25X1 1.5 SAFETY (NEEDLE) ×1 IMPLANT
NEEDLE SPNL 22GX3.5 QUINCKE BK (NEEDLE) ×1 IMPLANT
NS IRRIG 1000ML POUR BTL (IV SOLUTION) ×1 IMPLANT
PACK LAMINECTOMY NEURO (CUSTOM PROCEDURE TRAY) ×1 IMPLANT
PAD ARMBOARD 7.5X6 YLW CONV (MISCELLANEOUS) ×3 IMPLANT
PLATE ACP 1.6X40 2LVL (Plate) IMPLANT
SCREW ACP VA ST 3.5X15 (Screw) IMPLANT
SPIKE FLUID TRANSFER (MISCELLANEOUS) ×1 IMPLANT
SPONGE INTESTINAL PEANUT (DISPOSABLE) ×1 IMPLANT
SPONGE SURGIFOAM ABS GEL SZ50 (HEMOSTASIS) ×1 IMPLANT
SUT VIC AB 0 CT1 27 (SUTURE) ×1
SUT VIC AB 0 CT1 27XBRD ANTBC (SUTURE) IMPLANT
SUT VIC AB 3-0 SH 8-18 (SUTURE) ×1 IMPLANT
TOWEL GREEN STERILE (TOWEL DISPOSABLE) ×1 IMPLANT
TOWEL GREEN STERILE FF (TOWEL DISPOSABLE) ×1 IMPLANT
WATER STERILE IRR 1000ML POUR (IV SOLUTION) ×1 IMPLANT

## 2022-03-26 NOTE — Anesthesia Postprocedure Evaluation (Signed)
Anesthesia Post Note  Patient: Renee Pitts  Procedure(s) Performed: Cervical Three Through Four, Cervical Four Through Five Anterior Cervical Decompression and Discectomy and Fusion (Spine Cervical)     Patient location during evaluation: PACU Anesthesia Type: General Level of consciousness: awake and alert Pain management: pain level controlled Vital Signs Assessment: post-procedure vital signs reviewed and stable Respiratory status: spontaneous breathing, nonlabored ventilation, respiratory function stable and patient connected to nasal cannula oxygen Cardiovascular status: blood pressure returned to baseline and stable Postop Assessment: no apparent nausea or vomiting Anesthetic complications: no   No notable events documented.  Last Vitals:  Vitals:   03/26/22 1445 03/26/22 1505  BP: (!) 159/68 (!) 175/85  Pulse: 68 68  Resp: 11 18  Temp:  36.7 C  SpO2: 99% 95%    Last Pain:  Vitals:   03/26/22 1505  TempSrc: Oral  PainSc:                  [name]

## 2022-03-26 NOTE — Anesthesia Procedure Notes (Signed)
Procedure Name: Intubation Date/Time: 03/26/2022 9:20 AM  Performed by: Wilburn Cornelia, CRNAPre-anesthesia Checklist: Patient identified, Emergency Drugs available, Suction available, Patient being monitored and Timeout performed Patient Re-evaluated:Patient Re-evaluated prior to induction Oxygen Delivery Method: Circle system utilized Preoxygenation: Pre-oxygenation with 100% oxygen Induction Type: IV induction, Cricoid Pressure applied and Rapid sequence Laryngoscope Size: Glidescope and 3 Grade View: Grade I Tube type: Oral Tube size: 7.0 mm Number of attempts: 1 Airway Equipment and Method: Rigid stylet and Video-laryngoscopy Placement Confirmation: positive ETCO2, ETT inserted through vocal cords under direct vision, CO2 detector and breath sounds checked- equal and bilateral Secured at: 21 cm Tube secured with: Tape Dental Injury: Teeth and Oropharynx as per pre-operative assessment  Comments: Head and neck neutral - positioned to patient's comfort prior to induction.  Pt stated she was nauseous just prior to induction - rapid sequence intubation with cricoid pressure utilized.

## 2022-03-26 NOTE — H&P (Signed)
BP (!) 154/88   Pulse 71   Temp 98.3 F (36.8 C) (Oral)   Resp 18   Ht '5\' 7"'$  (1.702 m)   Wt 92.5 kg   SpO2 94%   BMI 31.95 kg/m  Renee Pitts is a 61 year old who presents today for followup.  She has moved to an assisted living home.  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  She has had chronic osteomyelitis but has not had a blood-borne infection.  She was seen and evaluated by my partner, Dr. Duffy Rhody.  Prior to her move, he was going to proceed with an anterior cervical decompression at C3-4, C4-5, and subsequent arthrodesis.  I agree with his interpretation of the MRI.  She does have changes at other levels, but they would have nothing to do with the bilateral hand numbness and numbness that she has in her lower extremity also.  I also agree with proceeding with an ACDF there.  There is a good chance she could leave that day. Will certainly do it at the hospital only because she has a few medical issues, but what I told her was to contact us to let me know what window she has available for the surgery.  Her transportation is not free-flowing, and she will have to make long-term plans to be able to arrive at the hospital and leave.  So, I will leave that to her to give [place] a call, but she is happy to proceed.  I did offer Dr. Marcello Moores and told her that I would be more than happy to just followup out here in [place], but she said she just wanted to get it done and she was fine with me doing the operation. Family History  Problem Relation Age of Onset   Early death Mother    COPD Sister    Early death Sister    Heart disease Sister    Early death Brother    Heart disease Brother    Liver cancer Paternal Uncle    Pancreatic cancer Paternal Uncle    Liver cancer Maternal Grandfather    Pancreatic cancer Maternal Grandfather    Colon cancer Neg Hx    Esophageal cancer Neg Hx    Colon polyps Neg Hx    Stomach cancer Neg Hx     Social History   Socioeconomic History   Marital status: Single    Spouse name: Not on file   Number of children: Not on file   Years of education: Not on file   Highest education level: Not on file  Occupational History   Not on file  Tobacco Use   Smoking status: Every Day    Packs/day: 1.00    Years: 15.00    Total pack years: 15.00    Types: Cigarettes   Smokeless tobacco: Never  Vaping Use   Vaping Use: Never used  Substance and Sexual Activity   Alcohol use: Never   Drug use: Never   Sexual activity: Not Currently    Birth control/protection: None  Other Topics Concern   Not on file  Social History Narrative   Not on file   Social Determinants of Health   Financial Resource Strain: Low Risk  (12/14/2021)   Overall Financial Resource Strain (CARDIA)    Difficulty of Paying Living Expenses: Not hard at all  Food Insecurity: No Food Insecurity (12/14/2021)   Hunger Vital Sign    Worried About Running Out of Food  in the Last Year: Never true    [place] in the Last Year: Never true  Transportation Needs: No Transportation Needs (12/14/2021)   PRAPARE - Hydrologist (Medical): No    Lack of Transportation (Non-Medical): No  Physical Activity: Inactive (12/14/2021)   Exercise Vital Sign    Days of Exercise per Week: 0 days    Minutes of Exercise per Session: 0 min  Stress: No Stress Concern Present (12/14/2021)   [place]    Feeling of Stress : Not at all  Social Connections: Socially Isolated (12/14/2021)   Social Connection and Isolation Panel [NHANES]    Frequency of Communication with Friends and Family: Three times a week    Frequency of Social Gatherings with Friends and Family: Once a week    Attends Religious Services: Never    Marine scientist or Organizations: No    Attends Archivist Meetings: Never    Marital Status: Never married   Intimate Partner Violence: Not At Risk (12/14/2021)   Humiliation, Afraid, Rape, and Kick questionnaire    Fear of Current or Ex-Partner: No    Emotionally Abused: No    Physically Abused: No    Sexually Abused: No   Past Surgical History:  Procedure Laterality Date   ABDOMINAL HYSTERECTOMY     endometriosis   APPENDECTOMY     BREAST SURGERY     CHOLECYSTECTOMY     COLON SURGERY     "twisted colon"   COLONOSCOPY     FRACTURE SURGERY     JOINT REPLACEMENT Right    mult R knee   LEG AMPUTATION THROUGH FEMUR Right    Past Medical History:  Diagnosis Date   Allergy    Anxiety    Arthritis    Blood transfusion without reported diagnosis    Cancer ([place])    Skin   Complication of anesthesia    Difficulty breathing when waking up; last happened 2-3 years ago   COPD (chronic obstructive pulmonary disease) ([place])    Depression    GERD (gastroesophageal reflux disease)    Osteomyelitis ([place])    Right leg DVT ([place]) 01/06/2019   non-occlusive right femoral DVT   Sleep apnea    Tuberculosis    Skin test posituve only      MEDICATIONS :  She is taking Cymbalta, Hydromorphone, Lyrica, and Mirapex.     ALLERGIES :  She is allergic to Morphine. OR for acdf. BP (!) 154/88   Pulse 71   Temp 98.3 F (36.8 C) (Oral)   Resp 18   Ht '5\' 7"'$  (1.702 m)   Wt 92.5 kg   SpO2 94%   BMI 31.95 kg/m  Renee Pitts is a 61 y.o. female  has decided to undergo an anterior cervical decompression and arthrodesis for stenosis at levels 3/4,4/5. Risks and benefits including but not limited to bleeding, infection, paralysis, weakness in one or both extremities, bowel and/or bladder dysfunction, fusion failure, hardware failure, need for further surgery, no relief of pain. She understands and wishes to proceed.

## 2022-03-26 NOTE — Transfer of Care (Signed)
Immediate Anesthesia Transfer of Care Note  Patient: CHRYSTEN WOULFE  Procedure(s) Performed: Cervical Three Through Four, Cervical Four Through Five Anterior Cervical Decompression and Discectomy and Fusion (Spine Cervical)  Patient Location: PACU  Anesthesia Type:General  Level of Consciousness: awake, alert , and oriented  Airway & Oxygen Therapy: Patient Spontanous Breathing and Patient connected to nasal cannula oxygen  Post-op Assessment: Report given to RN, Post -op Vital signs reviewed and stable, and Patient moving all extremities X 4  Post vital signs: Reviewed and stable  Last Vitals:  Vitals Value Taken Time  BP 171/82 03/26/22 1217  Temp    Pulse 86 03/26/22 1221  Resp 15 03/26/22 1221  SpO2 96 % 03/26/22 1221  Vitals shown include unvalidated device data.  Last Pain:  Vitals:   03/26/22 0712  TempSrc:   PainSc: 5       Patients Stated Pain Goal: 1 (88/82/80 0349)  Complications: No notable events documented.

## 2022-03-27 DIAGNOSIS — M4802 Spinal stenosis, cervical region: Secondary | ICD-10-CM | POA: Diagnosis not present

## 2022-03-27 MED ORDER — OXYCODONE HCL 5 MG PO TABS: 5.0000 mg | ORAL_TABLET | Freq: Four times a day (QID) | ORAL | 0 refills | Status: AC | PRN

## 2022-03-27 MED ORDER — NALOXONE HCL 4 MG/0.1ML NA LIQD: 1.0000 | Freq: Once | NASAL | 1 refills | Status: AC | PRN

## 2022-03-27 NOTE — Progress Notes (Signed)
PT Cancellation Note  Patient Details Name: Renee Pitts MRN: [id number] DOB: Aug 09, 1960   Cancelled Treatment:    Reason Eval/Treat Not Completed: PT screened, no needs identified, will sign off Per OT, patient independent with transfers. All education completed by OT. No skilled PT needs identified acutely. PT will sign off.  Maximiano Lott A. Gilford Rile PT, DPT Acute Rehabilitation Services Office [phone]    Linna Hoff 03/27/2022, 9:42 AM

## 2022-03-27 NOTE — Discharge Instructions (Signed)
Wound Care Leave incision open to air. You may shower. Do not scrub directly on incision.  Do not put any creams, lotions, or ointments on incision. Activity Walk each and every day, increasing distance each day. No lifting greater than 5 lbs.  Avoid excessive neck motion. No driving for 2 weeks; may ride as a passenger locally. Wear neck brace at all times except when showering.  If provided soft collar, may wear for comfort unless otherwise instructed. Diet Resume your normal diet.   Call Your Doctor If Any of These Occur Redness, drainage, or swelling at the wound.  Temperature greater than 101 degrees. Severe pain not relieved by pain medication. Increased difficulty swallowing. Incision starts to come apart. Follow Up Appt Call   [phone]) for problems.  If you have any hardware placed in your spine, you will need an x-ray before your appointment.

## 2022-03-27 NOTE — Progress Notes (Signed)
Orthopedic Tech Progress Note Patient Details:  Renee Pitts 10/08/1960 [id number]   Ortho Devices Type of Ortho Device: Soft collar Ortho Device/Splint Location: neck Ortho Device/Splint Interventions: Ordered, Application, Adjustment   Post Interventions Patient Tolerated: Well Instructions Provided: Care of device, Adjustment of device  Renee Pitts [name] 03/27/2022, 9:15 AM

## 2022-03-27 NOTE — Progress Notes (Signed)
Patient is discharged from room 3C06 at this time. Alert and in stable condition. IV site d/c'd and instructions read to patient with understanding verbalized and all questions answered. Left unit via wheelchair with all belongings at side.  

## 2022-03-27 NOTE — Evaluation (Signed)
Occupational Therapy Evaluation Patient Details Name: Renee Pitts MRN: [id number] DOB: January 10, 1961 Today's Date: 03/27/2022   History of Present Illness 61 yo s/p ACDF.  R BKA.   Clinical Impression   Patient admitted for the procedure above.  She is very close to her baseline, but is presenting with lethargy.  She has nursing assistance at her ILF, and appears to be very close to her baseline.  She uses a manual wheelchair at home, and a scooter for community mobility.  No post acute OT is anticipated.  Precautions were reviewed, and the patient verbalizes understanding.  She declined taking the precaution sheet.       Recommendations for follow up therapy are one component of a multi-disciplinary discharge planning process, led by the attending physician.  Recommendations may be updated based on patient status, additional functional criteria and insurance authorization.   Follow Up Recommendations  No OT follow up    Assistance Recommended at Discharge Intermittent Supervision/Assistance  Patient can return home with the following Assist for transportation    Functional Status Assessment  Patient has not had a recent decline in their functional status  Equipment Recommendations  None recommended by OT    Recommendations for Other Services       Precautions / Restrictions Precautions Precautions: Cervical Precaution Booklet Issued: Yes (comment) Restrictions Weight Bearing Restrictions: No      Mobility Bed Mobility Overal bed mobility: Modified Independent                  Transfers Overall transfer level: Needs assistance Equipment used: 1 person hand held assist Transfers: Sit to/from Stand, Bed to chair/wheelchair/BSC Sit to Stand: Supervision   Squat pivot transfers: Supervision              Balance Overall balance assessment: Needs assistance Sitting-balance support: Feet supported Sitting balance-Leahy Scale: Good                                      ADL either performed or assessed with clinical judgement   ADL Overall ADL's : At baseline                                             Vision Baseline Vision/History: 1 Wears glasses Patient Visual Report: No change from baseline       Perception Perception Perception: Within Functional Limits   Praxis Praxis Praxis: Intact    Pertinent Vitals/Pain Pain Assessment Pain Assessment: Faces Faces Pain Scale: Hurts little more Pain Location: Incisional Pain Descriptors / Indicators: Tender Pain Intervention(s): Monitored during session     Hand Dominance Right   Extremity/Trunk Assessment Upper Extremity Assessment Upper Extremity Assessment: Overall WFL for tasks assessed   Lower Extremity Assessment Lower Extremity Assessment: Overall WFL for tasks assessed   Cervical / Trunk Assessment Cervical / Trunk Assessment: Neck Surgery   Communication Communication Communication: No difficulties   Cognition Arousal/Alertness: Lethargic, Suspect due to medications Behavior During Therapy: WFL for tasks assessed/performed Overall Cognitive Status: Within Functional Limits for tasks assessed                                       General Comments   VSS  Exercises     Shoulder Instructions      Home Living Family/patient expects to be discharged to:: Assisted living Living Arrangements: Alone Available Help at Discharge: Personal care attendant Type of Home: Apartment Home Access: Level entry;Elevator     Home Layout: One level     Bathroom Shower/Tub: Occupational psychologist: Handicapped height Bathroom Accessibility: Yes How Accessible: Accessible via wheelchair Home Equipment: Grab bars - tub/shower;Grab bars - toilet;Shower seat;Wheelchair - Press photographer          Prior Functioning/Environment Prior Level of Function : Independent/Modified Independent              Mobility Comments: wheelchair level ADLs Comments: Mod I        OT Problem List: Pain      OT Treatment/Interventions:      OT Goals(Current goals can be found in the care plan section) Acute Rehab OT Goals Patient Stated Goal: Return to her apartment OT Goal Formulation: With patient Time For Goal Achievement: 03/29/22 Potential to Achieve Goals: Good  OT Frequency:      Co-evaluation              AM-PAC OT "6 Clicks" Daily Activity     Outcome Measure Help from another person eating meals?: None Help from another person taking care of personal grooming?: None Help from another person toileting, which includes using toliet, bedpan, or urinal?: A Little Help from another person bathing (including washing, rinsing, drying)?: A Little Help from another person to put on and taking off regular upper body clothing?: None Help from another person to put on and taking off regular lower body clothing?: A Little 6 Click Score: 21   End of Session Equipment Utilized During Treatment: Cervical collar Nurse Communication: Mobility status  Activity Tolerance: Patient tolerated treatment well Patient left: in bed;with call bell/phone within reach  OT Visit Diagnosis: Unsteadiness on feet (R26.81);Pain Pain - Right/Left: Right (cervical)                Time: 8250-5397 OT Time Calculation (min): 19 min Charges:  OT General Charges $OT Visit: 1 Visit OT Evaluation $OT Eval Moderate Complexity: 1 Mod  03/27/2022  RP, OTR/L  Acute Rehabilitation Services  Office:  [phone]   Metta Clines 03/27/2022, 8:51 AM

## 2022-03-27 NOTE — Care Management (Signed)
  Transition of Care [facility]) Screening Note   Patient Details  Name: Renee Pitts Date of Birth: [birth year]   Transition of Care [facility]) CM/SW Contact:    Carles Collet, RN Phone Number: 03/27/2022, 9:34 AM    Transition of Care Department [facility]) has reviewed patient and no TOC needs have been identified at this time.

## 2022-03-27 NOTE — Discharge Summary (Signed)
Physician Discharge Summary  Patient ID: Renee Pitts MRN: [id number] DOB/AGE: 1960-06-17 61 y.o.  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 female underwent elective C3-5 ACDF.  She tolerated surgery well.  Postoperatively her radicular and myelopathic symptoms were improving.  She was tolerating normal diet.  Her pain was controlled on oral medication.  Her wound was clean dry and intact, trachea was midline.  She was having no difficulty in breathing.  Treatments: Surgery -ACDF C3-5  Discharge Exam: Blood pressure (!) 106/57, pulse 70, temperature 98.2 F (36.8 C), temperature source Oral, resp. rate 16, height _0  (1.702 m), weight 92.5 kg, SpO2 95 %. Awake, alert, oriented x3 PERRLA Neck soft, trachea midline, normal phonation Speech fluent, appropriate CN grossly intact 5/5 BUE/BLE, other than right AKA Wound c/d/i  Disposition: Discharge disposition: 01-Home or Self Care      I extensively counseled the patient on picking up her Narcan and she has chronic pain-patient currently on Dilaudid.  I will prescribe her oxycodone for postoperative pain for short period every 6 hours.  Extensively went over the risks of overdose given the significant narcotic therapy she is on at this time.  She verbalized understanding.  Allergies as of 03/27/2022       Reactions   Vancomycin Anaphylaxis, Shortness Of Breath   Metoclopramide Other (See Comments)   Reaction not recalled   Prochlorperazine Other (See Comments)   Agitation, Mental Status Changes, and increased RLS symptoms   Baclofen Hypertension   Benadryl [diphenhydramine] Other (See Comments)   "Hypes me up and causes my legs to go into motion very badly"   Morphine Hives,  Other (See Comments)   Ineffective, also        Medication List     TAKE these medications    butalbital-acetaminophen-caffeine 50-325-40 MG tablet Commonly known as: FIORICET Take 1 tablet by mouth every 6 (six) hours as needed for headache or migraine.   dicyclomine 10 MG capsule Commonly known as: BENTYL Take 1 capsule (10 mg total) by mouth in the morning and at bedtime.   DULoxetine 60 MG capsule Commonly known as: CYMBALTA Take 1 capsule by mouth twice daily What changed:  when to take this reasons to take this   HYDROmorphone 2 MG tablet Commonly known as: DILAUDID Take 1 tablet (2 mg total) by mouth 4 (four) times daily as needed (for pain). What changed: when to take this   mupirocin ointment 2 % Commonly known as: Brady 1 application. into the nose 2 (two) times daily.   naloxone 4 MG/0.1ML Liqd nasal spray kit Commonly known as: NARCAN Place 1 spray into the nose once as needed for up to 1 dose (overdose of narcotic).   ondansetron 4 MG tablet Commonly known as: Zofran Take 1 tablet (4 mg total) by mouth every 8 (eight) hours as needed for nausea or vomiting.   oxyCODONE 5 MG immediate release tablet Commonly known as: Oxy IR/ROXICODONE Take 1 tablet (5 mg total) by mouth every 6 (six) hours as needed for moderate pain ((score 4 to 6)).   pramipexole 1 MG tablet Commonly known as: MIRAPEX Take 1 tablet (1 mg total) by mouth 2 (two) times daily.   pregabalin 150 MG capsule Commonly known as: LYRICA Take 1  capsule (150 mg total) by mouth 3 (three) times daily.   tiZANidine 4 MG tablet Commonly known as: Zanaflex Take 1 tablet (4 mg total) by mouth every 6 (six) hours as needed for muscle spasms.   traZODone 50 MG tablet Commonly known as: DESYREL Take 0.5-1 tablets (25-50 mg total) by mouth at bedtime as needed for sleep.        Follow-up Information     Ashok Pall, MD. Call.   Specialty: Neurosurgery Why: As needed, If  symptoms worsen Contact information: [address] [place] [address] [phone]                 Signed: Theodoro Doing Taheera Thomann 03/27/2022, 9:16 AM

## 2022-03-30 ENCOUNTER — Encounter (HOSPITAL_COMMUNITY): Payer: Self-pay | Admitting: Neurosurgery

## 2022-04-01 ENCOUNTER — Other Ambulatory Visit: Payer: Self-pay | Admitting: Family Medicine

## 2022-04-01 LAB — TYPE AND SCREEN
ABO/RH(D): O POS
Antibody Screen: POSITIVE
Unit division: 0
Unit division: 0

## 2022-04-01 LAB — BPAM RBC
Blood Product Expiration Date: 202311202359
Blood Product Expiration Date: 202311282359
Unit Type and Rh: 5100
Unit Type and Rh: 9500

## 2022-04-02 ENCOUNTER — Other Ambulatory Visit: Payer: Self-pay | Admitting: Family Medicine

## 2022-04-19 NOTE — Op Note (Signed)
03/26/2022  6:55 PM  PATIENT:  Renee Pitts  61 y.o. female  PRE-OPERATIVE DIAGNOSIS:  CERVICAL STENOSIS 3/4,4/5  POST-OPERATIVE DIAGNOSIS:  CERVICAL STENOSIS C3/4,4/5  PROCEDURE:  Procedure(s): Cervical Three Through Four, Cervical Four Through Five Anterior Cervical Decompression and Discectomy and Fusion  SURGEON: Surgeon(s): Ashok Pall, MD  ASSISTANTS:none  ANESTHESIA:   general  EBL:  No intake/output data recorded.  BLOOD ADMINISTERED:none  CELL SAVER GIVEN:none  COUNT:per nursing  DRAINS: none   SPECIMEN:  No Specimen  DICTATION: 03/26/2022  7:00 PM   PROCEDURE:  Anterior Cervical decompression C3-5 Arthrodesis C3-5 with 42m structural allograft Anterior instrumentation(Nuvasive) C3-6  SURGEON:   Surgeon(s): CAshok Pall MD   ASSISTANTS:none  ANESTHESIA:   general  EBL:  No intake/output data recorded.  BLOOD ADMINISTERED:none  CELL SAVER GIVEN:none  COUNT:per nursing  DRAINS: none   SPECIMEN:  No Specimen  DICTATION: Mrs. WSchillerwas taken to the operating room, intubated, and placed under general anesthesia without difficulty. She was positioned supine with her head in slight extension on a horseshoe headrest. The neck was prepped and draped in a sterile manner. I infiltrated 3 cc's 1/2%lidocaine/1:200,000 strength epinephrine into the planned incision starting from the midline to the medial border of the left sternocleidomastoid muscle. I opened the incision with a 10 blade and dissected sharply through soft tissue to the platysma. I dissected in the plane superior to the platysma both rostrally and caudally. I then opened the platysma in a horizontal fashion with Metzenbaum scissors, and dissected in the inferior plane rostrally and caudally. With both blunt and sharp technique I created an avascular corridor to the cervical spine. I placed a spinal needle(s) in the disc space at C3/4 . I then reflected the longus colli from C3 to C5 and  placed self retaining retractors. I opened the disc space(s) at 3/4, 4/5 with a 15 blade. I removed disc with curettes, Kerrison punches, and the drill. Using the drill I removed osteophytes and prepared for the decompression.  I decompressed the spinal canal and the C4,5 root(s) with the drill, Kerrison punches, and the curettes. I used the microscope to aid in microdissection. I removed the posterior longitudinal ligament to fully expose and decompress the thecal sac. I exposed the roots laterally taking down the 3/4,4/5 uncovertebral joints. With the decompression complete I moved on to the arthrodesis. I used the drill to level the surfaces of C3,4,and 5. I removed soft tissue to prepare the disc space and the bony surfaces. I measured the space and placed 723mstructural allografts into the disc spaces.  I then placed the anterior instrumentation. I placed 2 screws in each vertebral body through the plate. I locked the screws into place. Intraoperative xray showed the graft, plate, and screws to be in good position. I irrigated the wound, achieved hemostasis, and closed the wound in layers. I approximated the platysma, and the subcuticular plane with vicryl sutures. I used Dermabond for a sterile dressing.   PLAN OF CARE: Admit for overnight observation  PATIENT DISPOSITION:  PACU - hemodynamically stable.   Delay start of Pharmacological VTE agent (>24hrs) due to surgical blood loss or risk of bleeding:  yes

## 2022-04-24 ENCOUNTER — Other Ambulatory Visit: Payer: Self-pay | Admitting: Family Medicine

## 2023-01-18 ENCOUNTER — Ambulatory Visit: Payer: 59

## 2023-01-18 ENCOUNTER — Other Ambulatory Visit: Payer: Self-pay | Admitting: Neurosurgery

## 2023-01-18 DIAGNOSIS — M5412 Radiculopathy, cervical region: Secondary | ICD-10-CM | POA: Diagnosis not present

## 2023-01-18 DIAGNOSIS — M4802 Spinal stenosis, cervical region: Secondary | ICD-10-CM

## 2023-03-17 ENCOUNTER — Other Ambulatory Visit: Payer: Self-pay | Admitting: Medical Genetics

## 2023-03-17 DIAGNOSIS — Z006 Encounter for examination for normal comparison and control in clinical research program: Secondary | ICD-10-CM

## 2023-08-05 ENCOUNTER — Telehealth: Payer: Self-pay | Admitting: *Deleted

## 2023-08-05 NOTE — Transitions of Care (Post Inpatient/ED Visit) (Signed)
   08/05/2023  Name: Renee Pitts MRN: [id number] DOB: 06/29/1960  Today's TOC FU Call Status: Today's TOC FU Call Status:: Successful TOC FU Call Completed TOC FU Call Complete Date: 08/05/23 Patient's Name and Date of Birth confirmed.  Transition Care Management Follow-up Telephone Call Date of Discharge: 08/04/23 Discharge Facility: Other [facility]) Name of Other (Non-[facility]) Discharge Facility: [facility] Type of Discharge: Inpatient Admission How have you been since you were released from the hospital?: Better Any questions or concerns?: No  Items Reviewed: Did you receive and understand the discharge instructions provided?: Yes Any new allergies since your discharge?: No Dietary orders reviewed?: Yes Did not complete entire TOC, pt is non [facility] practice, states she sees primary care provider Dr. Romana Juniper with [facility]  Medications Reviewed Today: Medications Reviewed Today   Medications were not reviewed in this encounter     Home Care and Equipment/Supplies: Any new equipment or medical supplies ordered?: No  Functional Questionnaire: Do you need assistance with bathing/showering or dressing?: No Do you need assistance with meal preparation?: No Do you need assistance with eating?: No Do you have difficulty maintaining continence: No Do you need assistance with getting out of bed/getting out of a chair/moving?: No Do you have difficulty managing or taking your medications?: No  Follow up appointments reviewed: PCP Follow-up appointment confirmed?:  (pt states she sees primary care provider Dr. Wynn Banker at [facility])    [facility], BSN RN Care Manager/ Transition of Care [place]/ [facility] [phone]

## 2024-03-14 ENCOUNTER — Other Ambulatory Visit: Payer: Self-pay | Admitting: Medical Genetics

## 2024-03-14 DIAGNOSIS — Z006 Encounter for examination for normal comparison and control in clinical research program: Secondary | ICD-10-CM

## 2024-04-11 LAB — GENECONNECT MOLECULAR SCREEN

## 2024-04-17 ENCOUNTER — Telehealth: Payer: Self-pay | Admitting: Medical Genetics

## 2024-04-25 NOTE — Telephone Encounter (Signed)
 [employer]   04/25/2024 10:31AM  Attempted to contact participant 3 times via phone call and MyChart message to discuss TNP results and offer a new [employer] order. Participant may contact the research team at any time to discuss TNP results and request a new [employer] order.   Best,  Jordyn Pennstrom, BS [place]  [facility] Department Clinical Research Specialist II Direct Dial: [phone]  Fax: [phone]
